# Patient Record
Sex: Female | Born: 1935 | Race: White | Hispanic: No | Smoking: Former smoker
Health system: Southern US, Community
[De-identification: ages and names within clinical notes are randomized; demographics above are authoritative.]

## PROBLEM LIST (undated history)

## (undated) ENCOUNTER — Emergency Department: Admission: EM | Discharge status: Absent without leave | Payer: Self-pay

## (undated) DIAGNOSIS — F32A Depression, unspecified: Secondary | ICD-10-CM

## (undated) DIAGNOSIS — I10 Essential (primary) hypertension: Secondary | ICD-10-CM

## (undated) DIAGNOSIS — F329 Major depressive disorder, single episode, unspecified: Secondary | ICD-10-CM

## (undated) DIAGNOSIS — G4700 Insomnia, unspecified: Secondary | ICD-10-CM

## (undated) HISTORY — PX: KNEE SURGERY: SHX244

## (undated) HISTORY — PX: CHOLECYSTECTOMY: SHX55

## (undated) HISTORY — PX: HERNIA REPAIR: SHX51

---

## 2009-06-10 ENCOUNTER — Ambulatory Visit: Payer: Self-pay | Admitting: Family Medicine

## 2009-06-10 DIAGNOSIS — I10 Essential (primary) hypertension: Secondary | ICD-10-CM | POA: Insufficient documentation

## 2009-06-10 DIAGNOSIS — Z8709 Personal history of other diseases of the respiratory system: Secondary | ICD-10-CM | POA: Insufficient documentation

## 2009-07-02 ENCOUNTER — Ambulatory Visit: Payer: Self-pay | Admitting: Family Medicine

## 2009-12-10 ENCOUNTER — Ambulatory Visit: Payer: Self-pay | Admitting: Emergency Medicine

## 2010-03-15 ENCOUNTER — Ambulatory Visit: Payer: Self-pay | Admitting: Family Medicine

## 2010-03-15 DIAGNOSIS — S20219A Contusion of unspecified front wall of thorax, initial encounter: Secondary | ICD-10-CM | POA: Insufficient documentation

## 2010-03-15 DIAGNOSIS — J209 Acute bronchitis, unspecified: Secondary | ICD-10-CM

## 2010-03-17 ENCOUNTER — Telehealth (INDEPENDENT_AMBULATORY_CARE_PROVIDER_SITE_OTHER): Payer: Self-pay | Admitting: *Deleted

## 2010-06-08 NOTE — Progress Notes (Signed)
  Phone Note Outgoing Call Call back at [facility] Phone [phone]   Call placed by: Lajean Saver RN,  March 17, 2010 12:36 PM Call placed to: Patient Summary of Call: Callback: No answer, message left with reason for call and call back with any questions or cocnerns

## 2010-06-08 NOTE — Assessment & Plan Note (Signed)
Summary: SINUS & COUGH/KH   Vital Signs:  Patient Profile:   77 Years Old Female CC:      Cold & URI symptoms Height:     64 inches Weight:      194 pounds O2 Sat:      96 % O2 treatment:    Room Air Temp:     100.5 degrees F oral Pulse rate:   80 / minute Pulse rhythm:   regular Resp:     20 per minute BP sitting:   143 / 72  (right arm) Cuff size:   regular  Pt. in pain?   no  Vitals Entered By: Lita Mains, RN                   Prior Medication List:  No prior medications documented  Updated Prior Medication List: VERAPAMIL HCL CR 240 MG CR-TABS (VERAPAMIL HCL) once daily  Current Allergies: No known allergies History of Present Illness History from: patient Chief Complaint: Cold & URI symptoms History of Present Illness: Patient has had scratchy throat blister and coughing. She had symptoms 10 days ago and then Saturday everything got worse. She has had blister inside her mouth initial. For 9+days she has had productive cough sinus comgestion and runny nose. No c/o fever. She currently has a temp of 100.5.  Also took care of a child w/ strep.  Current Problems: PHARYNGITIS, RAPID STREP TEST NEGATIVE (ICD-462) BRONCHITIS, ACUTE (ICD-466.0) UPPER RESPIRATORY INFECTION, ACUTE (ICD-465.9) FAMILY HISTORY DIABETES 1ST DEGREE RELATIVE (ICD-V18.0) PNEUMONIA, HX OF (ICD-V12.60) HYPERTENSION (ICD-401.9) UPPER RESPIRATORY INFECTION, ACUTE (ICD-465.9) BRONCHITIS, ACUTE (ICD-466.0) ACUTE NASOPHARYNGITIS (ICD-460)   Current Meds VERAPAMIL HCL CR 240 MG CR-TABS (VERAPAMIL HCL) once daily LORTAB 5 5-500 MG TABS (HYDROCODONE-ACETAMINOPHEN) once as needed BUMETANIDE 0.5 MG TABS (BUMETANIDE) once daily AUGMENTIN 875-125 MG TABS (AMOXICILLIN-POT CLAVULANATE) 1 by mouth 2 times daily * CLARITIN-D 24HRS/ OR ALEGRA-D 24 HRS 1 by mouth q day TUSSIONEX PENNKINETIC ER 8-10 MG/5ML LQCR (CHLORPHENIRAMINE-HYDROCODONE) sig 1 tsp by mouth twice a day  only fill if  affordable  REVIEW OF SYSTEMS Constitutional Symptoms       Complains of fever.     Denies chills and night sweats.  Eyes       Complains of glasses.      Denies eye pain and eye discharge. Ear/Nose/Throat/Mouth       Complains of frequent runny nose, sore throat, and hoarseness.      Denies change in hearing and ear pain.  Respiratory       Complains of productive cough, wheezing, and shortness of breath.      Denies dry cough, asthma, and bronchitis.  Cardiovascular       Denies murmurs and chest pain.    Gastrointestinal       Denies stomach pain and nausea/vomiting. Genitourniary       Denies painful urination. Musculoskeletal       Complains of muscle pain and joint pain.      Denies joint stiffness, decreased range of motion, redness, swelling, and muscle weakness.  Skin       Comments: hx of psoriasis Psych       Denies mood changes.  Past History:  Family History: Last updated: 06/10/2009 Family History Diabetes 1st degree relative-mother Family History of Stroke F 1st degree relative <60-mother  Social History: Last updated: 06/10/2009 Never Smoked Alcohol use-no Drug use-no  Risk Factors: Smoking Status: never (06/10/2009)  Past Medical History: Hypertension Pneumonia, hx of-2010  Past Surgical History: Cholecystectomy Inguinal herniorrhaphy  Family History: Reviewed history and no changes required. Family History Diabetes 1st degree relative-mother Family History of Stroke F 1st degree relative <60-mother  Social History: Reviewed history and no changes required. Never Smoked Alcohol use-no Drug use-no Smoking Status:  never Drug Use:  no Physical Exam General appearance: well developed, well nourished,mild distress Head: normocephalic, atraumatic Ears: normal, no lesions or deformities Nasal: pale, boggy, swollen nasal turbinates Oral/Pharynx: pharyngeal erythema with exudate, uvula midline without deviation Neck: supple,anterior  lymphadenopathy present Chest/Lungs: no rales, wheezes, or rhonchi bilateral, breath sounds equal without effort actively coughing Heart: regular rate and  rhythm, no murmur Neurological: grossly intact and non-focal Skin: no obvious rashes or lesions MSE: oriented to time, place, and person Assessment New Problems: PHARYNGITIS, RAPID STREP TEST NEGATIVE (ICD-462) BRONCHITIS, ACUTE (ICD-466.0) UPPER RESPIRATORY INFECTION, ACUTE (ICD-465.9) FAMILY HISTORY DIABETES 1ST DEGREE RELATIVE (ICD-V18.0) PNEUMONIA, HX OF (ICD-V12.60) HYPERTENSION (ICD-401.9) UPPER RESPIRATORY INFECTION, ACUTE (ICD-465.9) BRONCHITIS, ACUTE (ICD-466.0) ACUTE NASOPHARYNGITIS (ICD-460)  bronchitis                      nasopharyngitis           exposure to strp    may have started off w/ Coxsacchie virus  Patient Education: Patient and/or caregiver instructed in the following: rest fluids and Tylenol.  Plan New Medications/Changes: Sandria Senter ER 8-10 MG/5ML LQCR (CHLORPHENIRAMINE-HYDROCODONE) sig 1 tsp by mouth twice a day  only fill if affordable  #4 fl oz x 0, 06/10/2009, Hassan Rowan MD CLARITIN-D 24HRS/ OR ALEGRA-D 24 HRS 1 by mouth q day  #20 x 0, 06/10/2009, Hassan Rowan MD AUGMENTIN [phone] MG TABS (AMOXICILLIN-POT CLAVULANATE) 1 by mouth 2 times daily  #20 x 0, 06/10/2009, Hassan Rowan MD  New Orders: New Patient Level III [99203] T-Culture, Throat [04540-98119] Rapid Strep [14782] Follow Up: Follow up in 2-3 days if no improvement, Follow up on an as needed basis, Follow up with Primary Physician  The patient and/or caregiver has been counseled thoroughly with regard to medications prescribed including dosage, schedule, interactions, rationale for use, and possible side effects and they verbalize understanding.  Diagnoses and expected course of recovery discussed and will return if not improved as expected or if the condition worsens. Patient and/or caregiver verbalized understanding.   Prescriptions: TUSSIONEX PENNKINETIC ER 8-10 MG/5ML LQCR (CHLORPHENIRAMINE-HYDROCODONE) sig 1 tsp by mouth twice a day  only fill if affordable  #4 fl oz x 0   Entered and Authorized by:   Hassan Rowan MD   Signed by:   Hassan Rowan MD on 06/10/2009   Method used:   Print then Give to Patient   RxID:   [id number] CLARITIN-D 24HRS/ OR ALEGRA-D 24 HRS 1 by mouth q day  #20 x 0   Entered and Authorized by:   Hassan Rowan MD   Signed by:   Hassan Rowan MD on 06/10/2009   Method used:   Print then Give to Patient   RxID:   [id number] AUGMENTIN 875-125 MG TABS (AMOXICILLIN-POT CLAVULANATE) 1 by mouth 2 times daily  #20 x 0   Entered and Authorized by:   Hassan Rowan MD   Signed by:   Hassan Rowan MD on 06/10/2009   Method used:   Print then Give to Patient   RxID:   [id number]   Patient Instructions: 1)  Please schedule a follow-up appointment as needed. 2)  Please schedule an appointment with your primary doctor in : 3)  Take your antibiotic as prescribed until ALL of it is gone, but stop if you develop a rash or swelling and contact our office as soon as possible. 4)  Acute bronchitis symptoms for less than 10 days are not helped by antibiotics. take over the counter cough medications. call if no improvment in  5-7 days, sooner if increasing cough, fever, or new symptoms( shortness of breath, chest pain). 5)  strep culture obtained  Laboratory Results  Date/Time Received: June 10, 2009 11:41 AM  Date/Time Reported: June 10, 2009 11:41 AM   Other Tests  Rapid Strep: negative  Kit Test Internal QC: Negative   (Normal Range: Negative)

## 2010-06-08 NOTE — Assessment & Plan Note (Signed)
Summary: COUGH/TJ (rm 4)   Vital Signs:  Patient Profile:   77 Years Old Female CC:      dry and productive cough, SOB, fatigue with exertion x 5 days Height:     64 inches Weight:      191 pounds O2 Sat:      95 % O2 treatment:    Room Air Temp:     98.3 degrees F oral Pulse rate:   68 / minute Resp:     14 per minute BP sitting:   133 / 46  (left arm) Cuff size:   regular  Pt. in pain?   no  Vitals Entered By: Lajean Saver RN (March 15, 2010 1:37 PM)                   Updated Prior Medication List: VERAPAMIL HCL CR 240 MG CR-TABS (VERAPAMIL HCL) once daily LORTAB 5 5-500 MG TABS (HYDROCODONE-ACETAMINOPHEN) once as needed TESSALON 200 MG CAPS (BENZONATATE) 1 by mouth Q 12 HRS. NITROFURANTOIN MONOHYD MACRO 100 MG CAPS (NITROFURANTOIN MONOHYD MACRO)  TRIAMTERENE-HCTZ 37.5-25 MG TABS (TRIAMTERENE-HCTZ) once daily TESSALON PERLES 100 MG CAPS (BENZONATATE) 1 tab by mouth up to three times a day as needed for cough  Current Allergies: ! * LATEXHistory of Present Illness Chief Complaint: dry and productive cough, SOB, fatigue with exertion x 5 days History of Present Illness:  Subjective: Patient complains of URI symptoms that started 1.5 weeks ago with a dry cough and sinus congestion, and the cough persists, worse at night.  She fell one week ago while cleaning a deck, striking her right chest.  She was seen in the ER where she states a chest X-ray was negative.  She states that she was dispensed a rib belt at that time.   She continues to have soreness in her right lateral chest. No sore throat No pleuritic pain No wheezing + mild nasal congestion ? post-nasal drainage No sinus pain/pressure No itchy/red eyes No earache No hemoptysis No SOB No fever/chills No nausea No vomiting No abdominal pain No diarrhea No skin rashes No fatigue No myalgias No headache Used OTC meds without relief   REVIEW OF SYSTEMS Constitutional Symptoms      Denies fever,  chills, night sweats, weight loss, weight gain, and fatigue.  Eyes       Denies change in vision, eye pain, eye discharge, glasses, contact lenses, and eye surgery. Ear/Nose/Throat/Mouth       Complains of hoarseness.      Denies hearing loss/aids, change in hearing, ear pain, ear discharge, dizziness, frequent runny nose, frequent nose bleeds, sinus problems, sore throat, and tooth pain or bleeding.  Respiratory       Complains of dry cough, productive cough, and shortness of breath.      Denies wheezing, asthma, bronchitis, and emphysema/COPD.  Cardiovascular       Denies murmurs, chest pain, and tires easily with exhertion.    Gastrointestinal       Denies stomach pain, nausea/vomiting, diarrhea, constipation, blood in bowel movements, and indigestion. Genitourniary       Denies painful urination, blood or discharge from vagina, kidney stones, and loss of urinary control. Neurological       Denies paralysis, seizures, and fainting/blackouts. Musculoskeletal       Denies muscle pain, joint pain, joint stiffness, decreased range of motion, redness, swelling, muscle weakness, and gout.  Skin       Denies bruising, unusual mles/lumps or sores, and hair/skin  or nail changes.  Psych       Denies mood changes, temper/anger issues, anxiety/stress, speech problems, depression, and sleep problems. Other Comments: Patient fell while raking leaves about a week ago and bruised her right ribs. Patienthas taken Claritin, Nasonex, and tessalon pearls   Past History:  Past Medical History: Reviewed history from 06/10/2009 and no changes required. Hypertension Pneumonia, hx of-2010  Past Surgical History: Reviewed history from 06/10/2009 and no changes required. Cholecystectomy Inguinal herniorrhaphy  Family History: Reviewed history from 06/10/2009 and no changes required. Family History Diabetes 1st degree relative-mother Family History of Stroke F 1st degree relative <60-mother  Social  History: Reviewed history from 06/10/2009 and no changes required. Never Smoked Alcohol use-no Drug use-no   Objective:  No acute distress  Eyes:  Pupils are equal, round, and reactive to light and accomdation.  Extraocular movement is intact.  Conjunctivae are not inflamed.  Ears:  Canals normal.  Tympanic membranes normal.   Nose:  Normal septum.  Normal turbinates, mildly congested.   No sinus tenderness present. Pharynx:  Normal  Neck:  Supple.  No adenopathy is present.  No thyromegaly is present  Lungs:  Clear to auscultation.  Breath sounds are equal.  Chest:  Mild tenderness right lateral anterior chest but no crepitance Heart:  Regular rate and rhythm without murmurs, rubs, or gallops.  Abdomen:  Nontender without masses or hepatosplenomegaly.  Bowel sounds are present.  No CVA or flank tenderness.  Extremities:  No edema.   Skin:  No rash Assessment New Problems: CONTUSION, RIGHT CHEST WALL (ICD-922.1) ACUTE BRONCHITIS (ICD-466.0)   Plan New Medications/Changes: BENZONATATE 200 MG CAPS (BENZONATATE) One by mouth hs as needed cough  #12 x 0, 03/15/2010, Donna Christen MD AMOXICILLIN 875 MG TABS (AMOXICILLIN) One by mouth two times a day  #14 x 0, 03/15/2010, Donna Christen MD  New Orders: Est. Patient Level IV [16109] Planning Comments:   Begin amoxicillin, expectorant, cough suppressant at bedtime.  Increase fluid intake Recommend that she resume wearing her rib belt as needed.  May take ibuprofen as needed for rib discomfort. Followup with PCP if not improving 7 to 10 days.  Return for worsening symptoms.   The patient and/or caregiver has been counseled thoroughly with regard to medications prescribed including dosage, schedule, interactions, rationale for use, and possible side effects and they verbalize understanding.  Diagnoses and expected course of recovery discussed and will return if not improved as expected or if the condition worsens. Patient and/or caregiver  verbalized understanding.  Prescriptions: BENZONATATE 200 MG CAPS (BENZONATATE) One by mouth hs as needed cough  #12 x 0   Entered and Authorized by:   Donna Christen MD   Signed by:   Donna Christen MD on 03/15/2010   Method used:   Print then Give to Patient   RxID:   [id number] AMOXICILLIN 875 MG TABS (AMOXICILLIN) One by mouth two times a day  #14 x 0   Entered and Authorized by:   Donna Christen MD   Signed by:   Donna Christen MD on 03/15/2010   Method used:   Print then Give to Patient   RxID:   [id number]   Patient Instructions: 1)  Take Mucinex (guaifenesin) twice daily with plenty of fluids. 2)  Wear rib belt day time as needed. 3)  May take Ibuprofen 200mg , 4 tabs every 8 hours with food as needed for rib pain.  Orders Added: 1)  Est. Patient Level IV [29528]

## 2010-06-08 NOTE — Assessment & Plan Note (Signed)
Summary: CONGESTION,RUNNY NOSE,COUGH/TJ   Vital Signs:  Patient Profile:   77 Years Old Female CC:      productive cough, runny nose, sneezing, hoarsness X 10 daYS Height:     64 inches Weight:      192 pounds O2 Sat:      96 % O2 treatment:    Room Air Temp:     98.1 degrees F oral Pulse rate:   70 / minute Pulse rhythm:   regular Resp:     18 per minute BP sitting:   137 / 83  (right arm) Cuff size:   regular  Pt. in pain?   no  Vitals Entered By: Lajean Saver RN (July 02, 2009 12:23 PM)                   Updated Prior Medication List: VERAPAMIL HCL CR 240 MG CR-TABS (VERAPAMIL HCL) once daily LORTAB 5 5-500 MG TABS (HYDROCODONE-ACETAMINOPHEN) once as needed  Current Allergies (reviewed today): No known allergies History of Present Illness Chief Complaint: productive cough, runny nose, sneezing, hoarsness X 10 daYS History of Present Illness: PATIENT C/O COUGH AND CONGESTION. NO FEVER. WAS SEEN INITIALLY ON 2 FEB AND DXED WITH URI SINUSITIS. TREATED WITH AMOX. GOT OVER THAT THEN DEVELOPED SINUS CONGESTION. STATES SHE SAW HER PCP AND COMPLETED ERYTHOMYCIN SAT. COUGH IS WORSE AT NIGHT. SHE IS CONCERNED FOR POSSIBLE PNEUMONIA  REVIEW OF SYSTEMS Constitutional Symptoms       Complains of fatigue.     Denies fever, chills, night sweats, weight loss, and weight gain.  Eyes       Denies change in vision, eye pain, eye discharge, glasses, contact lenses, and eye surgery. Ear/Nose/Throat/Mouth       Complains of frequent runny nose, sinus problems, and hoarseness.      Denies hearing loss/aids, change in hearing, ear pain, ear discharge, dizziness, frequent nose bleeds, sore throat, and tooth pain or bleeding.  Respiratory       Complains of productive cough.      Denies dry cough, wheezing, shortness of breath, asthma, bronchitis, and emphysema/COPD.  Cardiovascular       Denies murmurs, chest pain, and tires easily with exhertion.    Gastrointestinal       Denies  stomach pain, nausea/vomiting, diarrhea, constipation, blood in bowel movements, and indigestion. Genitourniary       Denies painful urination, kidney stones, and loss of urinary control. Neurological       Denies paralysis, seizures, and fainting/blackouts. Musculoskeletal       Denies muscle pain, joint pain, joint stiffness, decreased range of motion, redness, swelling, muscle weakness, and gout.  Skin       Denies bruising, unusual mles/lumps or sores, and hair/skin or nail changes.  Psych       Denies mood changes, temper/anger issues, anxiety/stress, speech problems, depression, and sleep problems. Other Comments: Patient recently finished antibiotic Rx for sinus infection on Saturday   Past History:  Past Medical History: Reviewed history from 06/10/2009 and no changes required. Hypertension Pneumonia, hx of-2010  Past Surgical History: Reviewed history from 06/10/2009 and no changes required. Cholecystectomy Inguinal herniorrhaphy  Family History: Reviewed history from 06/10/2009 and no changes required. Family History Diabetes 1st degree relative-mother Family History of Stroke F 1st degree relative <60-mother  Social History: Reviewed history from 06/10/2009 and no changes required. Never Smoked Alcohol use-no Drug use-no Physical Exam General appearance: well developed, well nourished, no acute distress Head: normocephalic, atraumatic Ears: normal,  no lesions or deformities Nasal: mucosa pink, nonedematous, no septal deviation, turbinates normal Oral/Pharynx: tongue normal, posterior pharynx without erythema or exudate Chest/Lungs: no rales, wheezes, or rhonchi bilateral, breath sounds equal without effort. CONGESTED COUGH Heart: regular rate and  rhythm, no murmur Skin: no obvious rashes or lesions Assessment New Problems: UPPER RESPIRATORY INFECTION, ACUTE (ICD-465.9)   Plan New Medications/Changes: TESSALON 200 MG CAPS (BENZONATATE) 1 by mouth Q 12  HRS.  ##14 x 0, 07/02/2009, Marvis Moeller DO DOXYCYCLINE HYCLATE 100 MG CAPS (DOXYCYCLINE HYCLATE) 1 by mouth Q 12 HRS  ##20 x 0, 07/02/2009, Marvis Moeller DO  New Orders: Est. Patient Level II [16109]   Prescriptions: TESSALON 200 MG CAPS (BENZONATATE) 1 by mouth Q 12 HRS.  ##14 x 0   Entered and Authorized by:   Marvis Moeller DO   Signed by:   Marvis Moeller DO on 07/02/2009   Method used:   Print then Give to Patient   RxID:   [id number] DOXYCYCLINE HYCLATE 100 MG CAPS (DOXYCYCLINE HYCLATE) 1 by mouth Q 12 HRS  ##20 x 0   Entered and Authorized by:   Marvis Moeller DO   Signed by:   Marvis Moeller DO on 07/02/2009   Method used:   Print then Give to Patient   RxID:   [id number]   Patient Instructions: 1)  TYLENOL OR MOTRIN AS NEEDED. AVOID CAFFEINE AND MILK PRODUCTS. MUCINEX DM RECOMMENDED. FOLLOW UP WITH YOUR PCP OR RETURN IF SYMPTOMS PERSIST OR WORSEN.

## 2010-06-08 NOTE — Assessment & Plan Note (Signed)
Summary: SINUS AND COUGH   Vital Signs:  Patient Profile:   77 Years Old Female CC:      cough, wheeze, sinus pressure, low grade temp X 1 week Height:     64 inches Weight:      193 pounds O2 Sat:      95 % O2 treatment:    Room Air Temp:     99.0 degrees F oral Pulse rate:   75 / minute Resp:     14 per minute BP sitting:   132 / 78  (right arm) Cuff size:   regular  Pt. in pain?   no  Vitals Entered By: Lajean Saver RN (December 10, 2009 11:57 AM)                   Updated Prior Medication List: VERAPAMIL HCL CR 240 MG CR-TABS (VERAPAMIL HCL) once daily LORTAB 5 5-500 MG TABS (HYDROCODONE-ACETAMINOPHEN) once as needed DOXYCYCLINE HYCLATE 100 MG CAPS (DOXYCYCLINE HYCLATE) 1 by mouth Q 12 HRS TESSALON 200 MG CAPS (BENZONATATE) 1 by mouth Q 12 HRS. NITROFURANTOIN MONOHYD MACRO 100 MG CAPS (NITROFURANTOIN MONOHYD MACRO)  TRIAMTERENE-HCTZ 37.5-25 MG TABS (TRIAMTERENE-HCTZ) once daily  Current Allergies (reviewed today): No known allergies History of Present Illness Chief Complaint: cough, wheeze, sinus pressure, low grade temp X 1 week History of Present Illness: Cough, wheeze, sinus pressure, 99 temp for 1 week.  Had a UTI 2 weeks ago tx w/ Macrobid, but she is still having frequency, but no more burning.  Now her URI symptoms are bothering her.  She has no sick contacts except a granddaughter in daycare.    REVIEW OF SYSTEMS Constitutional Symptoms       Complains of fatigue.     Denies fever, chills, night sweats, weight loss, and weight gain.  Eyes       Denies change in vision, eye pain, eye discharge, glasses, contact lenses, and eye surgery. Ear/Nose/Throat/Mouth       Complains of sinus problems and hoarseness.      Denies hearing loss/aids, change in hearing, ear pain, ear discharge, dizziness, frequent runny nose, frequent nose bleeds, sore throat, and tooth pain or bleeding.  Respiratory       Complains of productive cough, wheezing, and shortness of breath.       Denies dry cough, asthma, bronchitis, and emphysema/COPD.  Cardiovascular       Denies murmurs, chest pain, and tires easily with exhertion.    Gastrointestinal       Denies stomach pain, nausea/vomiting, diarrhea, constipation, blood in bowel movements, and indigestion. Genitourniary       Denies painful urination, kidney stones, and loss of urinary control. Neurological       Denies paralysis, seizures, and fainting/blackouts. Musculoskeletal       Denies muscle pain, joint pain, joint stiffness, decreased range of motion, redness, swelling, muscle weakness, and gout.  Skin       Denies bruising, unusual mles/lumps or sores, and hair/skin or nail changes.  Psych       Denies mood changes, temper/anger issues, anxiety/stress, speech problems, depression, and sleep problems. Other Comments: patinet is currently on antibiotic series for UTI   Past History:  Past Medical History: Reviewed history from 06/10/2009 and no changes required. Hypertension Pneumonia, hx of-2010  Past Surgical History: Reviewed history from 06/10/2009 and no changes required. Cholecystectomy Inguinal herniorrhaphy  Family History: Reviewed history from 06/10/2009 and no changes required. Family History Diabetes 1st degree relative-mother Family  History of Stroke F 1st degree relative <60-mother  Social History: Reviewed history from 06/10/2009 and no changes required. Never Smoked Alcohol use-no Drug use-no Physical Exam General appearance: well developed, well nourished, no acute distress Ears: normal, no lesions or deformities Nasal: mucosa pink, nonedematous, no septal deviation, turbinates normal Oral/Pharynx: tongue normal, posterior pharynx without erythema or exudate Chest/Lungs: no rales, wheezes, or rhonchi bilateral, breath sounds equal without effort Heart: regular rate and  rhythm, no murmur Skin: no obvious rashes or lesions Assessment New Problems: UPPER RESPIRATORY INFECTION  (ICD-465.9)  Likely viral URI, but will treat with ABX due to age.  Also will tx with Cipro per her request because she doesn't feel that the Macrobid has worked well.  Patient Education: Patient and/or caregiver instructed in the following: rest, fluids.  Plan New Medications/Changes: TESSALON PERLES 100 MG CAPS (BENZONATATE) 1 tab by mouth up to three times a day as needed for cough  #24 x 0, 12/10/2009, Hoyt Koch MD CIPRO 250 MG TABS (CIPROFLOXACIN HCL) 1 tab by mouth two times a day for 5 days  #10 x 0, 12/10/2009, Hoyt Koch MD ZITHROMAX Z-PAK 250 MG TABS (AZITHROMYCIN) use as directed  #1 x 0, 12/10/2009, Hoyt Koch MD  New Orders: Est. Patient Level II [02585] Planning Comments:   Follow-up with your primary care physician  Follow Up: Follow up in 2-3 days if no improvement  The patient and/or caregiver has been counseled thoroughly with regard to medications prescribed including dosage, schedule, interactions, rationale for use, and possible side effects and they verbalize understanding.  Diagnoses and expected course of recovery discussed and will return if not improved as expected or if the condition worsens. Patient and/or caregiver verbalized understanding.  Prescriptions: TESSALON PERLES 100 MG CAPS (BENZONATATE) 1 tab by mouth up to three times a day as needed for cough  #24 x 0   Entered and Authorized by:   Hoyt Koch MD   Signed by:   Hoyt Koch MD on 12/10/2009   Method used:   Printed then faxed to ...       [facility] [phone]* (retail)       [address] [place], [place]  [address]       Ph: [phone]       Fax: [phone]   RxID:   [id number] CIPRO 250 MG TABS (CIPROFLOXACIN HCL) 1 tab by mouth two times a day for 5 days  #10 x 0   Entered and Authorized by:   Hoyt Koch MD   Signed by:   Hoyt Koch MD on 12/10/2009   Method used:   Printed then faxed to ...       [facility] [phone]* (retail)       [address] [place], [place]  [address]       Ph: [phone]       Fax: [phone]   RxID:   [id number] ZITHROMAX Z-PAK 250 MG TABS (AZITHROMYCIN) use as directed  #1 x 0   Entered and Authorized by:   Hoyt Koch MD   Signed by:   Hoyt Koch MD on 12/10/2009   Method used:   Printed then faxed to ...       [facility] [phone]* (retail)       [address] [place], [place]  [address]  Ph: [phone]       Fax: [phone]   RxID:   [id number]   Orders Added: 1)  Est. Patient Level II [40347]

## 2010-06-25 ENCOUNTER — Ambulatory Visit (INDEPENDENT_AMBULATORY_CARE_PROVIDER_SITE_OTHER): Payer: Medicare Other | Admitting: Emergency Medicine

## 2010-06-25 ENCOUNTER — Encounter: Payer: Self-pay | Admitting: Emergency Medicine

## 2010-06-25 DIAGNOSIS — J069 Acute upper respiratory infection, unspecified: Secondary | ICD-10-CM

## 2010-06-27 ENCOUNTER — Telehealth (INDEPENDENT_AMBULATORY_CARE_PROVIDER_SITE_OTHER): Payer: Self-pay | Admitting: *Deleted

## 2010-06-29 ENCOUNTER — Ambulatory Visit
Admission: RE | Admit: 2010-06-29 | Discharge: 2010-06-29 | Disposition: A | Discharge status: Home / Self Care | Payer: Medicare Other | Source: Ambulatory Visit | Attending: Emergency Medicine | Admitting: Emergency Medicine

## 2010-06-29 ENCOUNTER — Other Ambulatory Visit: Payer: Self-pay | Admitting: Emergency Medicine

## 2010-06-29 ENCOUNTER — Ambulatory Visit (INDEPENDENT_AMBULATORY_CARE_PROVIDER_SITE_OTHER): Payer: Medicare Other | Admitting: Emergency Medicine

## 2010-06-29 ENCOUNTER — Encounter: Payer: Self-pay | Admitting: Emergency Medicine

## 2010-06-29 DIAGNOSIS — J069 Acute upper respiratory infection, unspecified: Secondary | ICD-10-CM

## 2010-06-29 DIAGNOSIS — J13 Pneumonia due to Streptococcus pneumoniae: Secondary | ICD-10-CM | POA: Insufficient documentation

## 2010-06-30 NOTE — Assessment & Plan Note (Signed)
Summary: COUGHING rm 5   Vital Signs:  Patient Profile:   77 Years Old Female CC:      Cough Height:     64 inches Weight:      198.25 pounds O2 Sat:      96 % O2 treatment:    Room Air Temp:     99.2 degrees F oral Pulse rate:   70 / minute Resp:     18 per minute BP sitting:   153 / 75  (left arm) Cuff size:   regular  Vitals Entered By: Clemens Catholic LPN (June 25, 2010 8:38 AM)                  Updated Prior Medication List: VERAPAMIL HCL CR 240 MG CR-TABS (VERAPAMIL HCL) once daily TRIAMTERENE-HCTZ 37.5-25 MG TABS (TRIAMTERENE-HCTZ) once daily LORAZEPAM 0.5 MG TABS (LORAZEPAM)  PRAVASTATIN SODIUM 20 MG TABS (PRAVASTATIN SODIUM)  ASPIRIN 81 MG TBEC (ASPIRIN)   Current Allergies: ! PREDNISONE ! * LATEXHistory of Present Illness History from: patient Chief Complaint: Cough History of Present Illness: 77 Years Old Female complains of onset of cold symptoms for 1 days.  Yachet has been using Tessalon, Mucinex which is helping a little bit. + sore throat + cough No pleuritic pain No wheezing + nasal congestion + post-nasal drainage No sinus pain/pressure No chest congestion No itchy/red eyes No earache No hemoptysis No SOB + chills/sweats No fever No nausea No vomiting No abdominal pain No diarrhea No skin rashes No fatigue No myalgias No headache   REVIEW OF SYSTEMS Constitutional Symptoms      Denies fever, chills, night sweats, weight loss, weight gain, and fatigue.  Eyes       Complains of eye surgery.      Denies change in vision, eye pain, eye discharge, glasses, and contact lenses. Ear/Nose/Throat/Mouth       Complains of sinus problems.      Denies hearing loss/aids, change in hearing, ear pain, ear discharge, dizziness, frequent runny nose, frequent nose bleeds, sore throat, hoarseness, and tooth pain or bleeding.  Respiratory       Complains of productive cough, wheezing, and shortness of breath.      Denies dry cough, asthma,  bronchitis, and emphysema/COPD.  Cardiovascular       Denies murmurs, chest pain, and tires easily with exhertion.    Gastrointestinal       Denies stomach pain, nausea/vomiting, diarrhea, constipation, blood in bowel movements, and indigestion. Genitourniary       Denies painful urination, kidney stones, and loss of urinary control. Neurological       Denies paralysis, seizures, and fainting/blackouts. Musculoskeletal       Denies muscle pain, joint pain, joint stiffness, decreased range of motion, redness, swelling, muscle weakness, and gout.  Skin       Denies bruising, unusual mles/lumps or sores, and hair/skin or nail changes.  Psych       Denies mood changes, temper/anger issues, anxiety/stress, speech problems, depression, and sleep problems. Other Comments: pt c/o cough, chest hurts to cough and SOB x 1day. she has taken Mucinex and Tessalon Perle.   Past History:  Past Medical History: Reviewed history from 06/10/2009 and no changes required. Hypertension Pneumonia, hx of-2010  Past Surgical History: Reviewed history from 06/10/2009 and no changes required. Cholecystectomy Inguinal herniorrhaphy  Family History: Reviewed history from 06/10/2009 and no changes required. Family History Diabetes 1st degree relative-mother Family History of Stroke F 1st degree relative <60-mother  Social History: Reviewed history from 06/10/2009 and no changes required. Never Smoked Alcohol use-no Drug use-no Physical Exam General appearance: well developed, well nourished, no acute distress Ears: normal, no lesions or deformities Nasal: clear discharge Oral/Pharynx: clear PND, no erythema Chest/Lungs: no rales, wheezes, or rhonchi bilateral, breath sounds equal without effort Heart: regular rate and  rhythm, no murmur Skin: no obvious rashes or lesions MSE: oriented to time, place, and person Assessment New Problems: UPPER RESPIRATORY INFECTION, ACUTE (ICD-465.9)   Patient  Education: Patient and/or caregiver instructed in the following: rest, fluids.  Plan New Medications/Changes: ZITHROMAX Z-PAK 250 MG TABS (AZITHROMYCIN) use as directed  #1 x 0, 06/25/2010, Hoyt Koch MD  New Orders: Est. Patient Level III [16109] Pulse Oximetry (single measurment) [94760] Planning Comments:   1)  Take the prescribed antibiotic as instructed. 2)  Use nasal saline solution (over the counter) at least 3 times a day. 3)  Use over the counter decongestants like Zyrtec-D every 12 hours as needed to help with congestion. 4)  Can take tylenol every 6 hours or motrin every 8 hours for pain or fever. 5)  Follow up with your primary doctor  if no improvement in 5-7 days, sooner if increasing pain, fever, or new symptoms.   6) Continue your tessalon 7) If you think you're getting worse, return to clinic for CXR   The patient and/or caregiver has been counseled thoroughly with regard to medications prescribed including dosage, schedule, interactions, rationale for use, and possible side effects and they verbalize understanding.  Diagnoses and expected course of recovery discussed and will return if not improved as expected or if the condition worsens. Patient and/or caregiver verbalized understanding.  Prescriptions: ZITHROMAX Z-PAK 250 MG TABS (AZITHROMYCIN) use as directed  #1 x 0   Entered and Authorized by:   Hoyt Koch MD   Signed by:   Hoyt Koch MD on 06/25/2010   Method used:   Printed then faxed to ...       [facility] [phone]* (retail)       [address] [place], [place]  [address]       Ph: [phone]       Fax: [phone]   RxID:   [id number]   Orders Added: 1)  Est. Patient Level III [02725] 2)  Pulse Oximetry (single measurment) [36644]

## 2010-07-02 ENCOUNTER — Telehealth (INDEPENDENT_AMBULATORY_CARE_PROVIDER_SITE_OTHER): Payer: Self-pay | Admitting: *Deleted

## 2010-07-06 NOTE — Assessment & Plan Note (Signed)
Summary: COUGH/SNEEZING/FEVER rm 4   Vital Signs:  Patient Profile:   77 Years Old Female CC:      cough, HA Height:     64 inches O2 Sat:      95 % O2 treatment:    Room Air Temp:     100.2 degrees F oral Pulse rate:   69 / minute Resp:     18 per minute BP sitting:   130 / 58  (left arm) Cuff size:   regular  Vitals Entered By: Clemens Catholic LPN (June 29, 2010 4:16 PM)                  Updated Prior Medication List: VERAPAMIL HCL CR 240 MG CR-TABS (VERAPAMIL HCL) once daily TRIAMTERENE-HCTZ 37.5-25 MG TABS (TRIAMTERENE-HCTZ) once daily LORAZEPAM 0.5 MG TABS (LORAZEPAM)  PRAVASTATIN SODIUM 20 MG TABS (PRAVASTATIN SODIUM)  ASPIRIN 81 MG TBEC (ASPIRIN)  ZITHROMAX Z-PAK 250 MG TABS (AZITHROMYCIN) use as directed  Current Allergies (reviewed today): ! PREDNISONE ! * LATEXHistory of Present Illness Chief Complaint: cough, HA History of Present Illness: She returns to clinic not feeling any better.  She was last seen a few days ago after being sick for 1 day.  It has now been 5 days.  Still with cough, congestion, sore throat.  She took her Zpak.  REVIEW OF SYSTEMS Constitutional Symptoms      Denies fever, chills, night sweats, weight loss, weight gain, and fatigue.  Eyes       Denies change in vision, eye pain, eye discharge, glasses, contact lenses, and eye surgery. Ear/Nose/Throat/Mouth       Complains of sinus problems and hoarseness.      Denies hearing loss/aids, change in hearing, ear pain, ear discharge, dizziness, frequent runny nose, frequent nose bleeds, sore throat, and tooth pain or bleeding.  Respiratory       Complains of productive cough and wheezing.      Denies dry cough, shortness of breath, asthma, bronchitis, and emphysema/COPD.  Cardiovascular       Denies murmurs, chest pain, and tires easily with exhertion.    Gastrointestinal       Denies stomach pain, nausea/vomiting, diarrhea, constipation, blood in bowel movements, and  indigestion. Genitourniary       Denies painful urination, kidney stones, and loss of urinary control. Neurological       Denies paralysis, seizures, and fainting/blackouts. Musculoskeletal       Denies muscle pain, joint pain, joint stiffness, decreased range of motion, redness, swelling, muscle weakness, and gout.  Skin       Denies bruising, unusual mles/lumps or sores, and hair/skin or nail changes.  Psych       Denies mood changes, temper/anger issues, anxiety/stress, speech problems, depression, and sleep problems. Other Comments: pt c/o productive cough, HA, head and chest congestion no better. fever started yesterday. she is taking OTC Mucinex. she took her last pill of her Zpak today.   Past History:  Past Medical History: Reviewed history from 06/10/2009 and no changes required. Hypertension Pneumonia, hx of-2010  Past Surgical History: Reviewed history from 06/10/2009 and no changes required. Cholecystectomy Inguinal herniorrhaphy  Family History: Reviewed history from 06/10/2009 and no changes required. Family History Diabetes 1st degree relative-mother Family History of Stroke F 1st degree relative <60-mother  Social History: Reviewed history from 06/10/2009 and no changes required. Never Smoked Alcohol use-no Drug use-no Physical Exam General appearance: well developed, well nourished, no acute distress Ears: slight TM erythema R  Nasal: nasal congestion Oral/Pharynx: clear PND, no erythema Chest/Lungs: mild scattered rhonchi Heart: regular rate and  rhythm, no murmur MSE: oriented to time, place, and person Assessment New Problems: PNEUMONIA, COMMUNITY ACQUIRED, PNEUMOCOCCAL (ICD-481) COUGH (ICD-786.2) UPPER RESPIRATORY INFECTION, ACUTE (ICD-465.9)   Plan New Medications/Changes: AVELOX 400 MG TABS (MOXIFLOXACIN HCL) 1 daily for 7 days  #7 x 0, 06/29/2010, Hoyt Koch MD HYDROCODONE-HOMATROPINE 5-1.5 MG/5ML SYRP [facility]) 5cc  q6 hrs as needed cough  #5oz x 0, 06/29/2010, Hoyt Koch MD  New Orders: Est. Patient Level IV [41324] Pulse Oximetry (single measurment) [94760] T-Chest x-ray, 2 views [71020] Rocephin  250mg  [J0696] Admin of Therapeutic Inj  intramuscular or subcutaneous [96372] Planning Comments:   Xray shows a small focus of possible PNA R midlung with bronchitic changs.  Suggest follow up with her PCP in 1 week.  Will need repeat CXR to confirm recovery in a few weeks. 1)  Take the prescribed antibiotic as instructed.  Finished Zpak, will start Avelox for 1 week 2)  Use nasal saline solution (over the counter) at least 3 times a day. 3)  Use over the counter Mucinex 4)  Can take tylenol every 6 hours or motrin every 8 hours for pain or fever. 5)  Follow up with your primary doctor  if no improvement in 5-7 days, sooner if increasing pain, fever, or new symptoms.    The patient and/or caregiver has been counseled thoroughly with regard to medications prescribed including dosage, schedule, interactions, rationale for use, and possible side effects and they verbalize understanding.  Diagnoses and expected course of recovery discussed and will return if not improved as expected or if the condition worsens. Patient and/or caregiver verbalized understanding.  Prescriptions: AVELOX 400 MG TABS (MOXIFLOXACIN HCL) 1 daily for 7 days  #7 x 0   Entered and Authorized by:   Hoyt Koch MD   Signed by:   Hoyt Koch MD on 06/29/2010   Method used:   Print then Give to Patient   RxID:   [id number] HYDROCODONE-HOMATROPINE 5-1.5 MG/5ML SYRP (HYDROCODONE-HOMATROPINE) 5cc q6 hrs as needed cough  #5oz x 0   Entered and Authorized by:   Hoyt Koch MD   Signed by:   Hoyt Koch MD on 06/29/2010   Method used:   Print then Give to Patient   RxID:   [id number]   Medication Administration  Injection # 1:    Medication: Rocephin  250mg     Diagnosis: PNEUMONIA, COMMUNITY  ACQUIRED, PNEUMOCOCCAL (ICD-481)    Route: IM    Site: RUOQ gluteus    Exp Date: 09/06/2012    Lot #: [id number]    Mfr: [email or web address]    Comments: 1gram given    Patient tolerated injection without complications    Given by: Clemens Catholic LPN (June 29, 2010 5:11 PM)  Orders Added: 1)  Est. Patient Level IV [16606] 2)  Pulse Oximetry (single measurment) [94760] 3)  T-Chest x-ray, 2 views [71020] 4)  Rocephin  250mg  [J0696] 5)  Admin of Therapeutic Inj  intramuscular or subcutaneous [30160]

## 2010-07-06 NOTE — Progress Notes (Signed)
  Phone Note Outgoing Call Call back at [facility] Phone [phone] P PH     Call placed by: Lajean Saver RN,  July 02, 2010 3:06 PM Call placed to: Patient Action Taken: Phone Call Completed Summary of Call: Callback: Patient reports she is feeling better but is wheezing.  Rx fir proventil HFA inhaler called into [facility] @ [address] in [place]

## 2010-07-06 NOTE — Progress Notes (Signed)
  Phone Note Outgoing Call   Call placed by: Clemens Catholic LPN,  June 27, 2010 3:56 PM Call placed to: Patient Summary of Call: call back: no answer @ pts home #. Initial call taken by: Clemens Catholic LPN,  June 27, 2010 3:56 PM

## 2011-05-09 ENCOUNTER — Encounter: Payer: Self-pay | Admitting: Emergency Medicine

## 2011-05-09 ENCOUNTER — Emergency Department (INDEPENDENT_AMBULATORY_CARE_PROVIDER_SITE_OTHER)
Admission: EM | Admit: 2011-05-09 | Discharge: 2011-05-09 | Disposition: A | Discharge status: Home / Self Care | Payer: Medicare Other | Source: Home / Self Care | Attending: Emergency Medicine | Admitting: Emergency Medicine

## 2011-05-09 ENCOUNTER — Emergency Department
Admit: 2011-05-09 | Discharge: 2011-05-09 | Disposition: A | Discharge status: Home / Self Care | Payer: Medicare Other | Attending: Emergency Medicine | Admitting: Emergency Medicine

## 2011-05-09 DIAGNOSIS — R05 Cough: Secondary | ICD-10-CM

## 2011-05-09 DIAGNOSIS — J189 Pneumonia, unspecified organism: Secondary | ICD-10-CM

## 2011-05-09 DIAGNOSIS — J209 Acute bronchitis, unspecified: Secondary | ICD-10-CM

## 2011-05-09 HISTORY — DX: Essential (primary) hypertension: I10

## 2011-05-09 MED ORDER — CEFTRIAXONE SODIUM 1 G IJ SOLR
1.0000 g | Freq: Once | INTRAMUSCULAR | Status: AC
  Administered 2011-05-09: 1 g via INTRAMUSCULAR

## 2011-05-09 MED ORDER — LEVOFLOXACIN 750 MG PO TABS: 750.0000 mg | ORAL_TABLET | Freq: Every day | ORAL | Status: AC

## 2011-05-09 NOTE — ED Provider Notes (Signed)
History     CSN: [id number]  Arrival date & time 05/09/11  0945   First MD Initiated Contact with Patient 05/09/11 1028      Chief Complaint  Patient presents with  . Cough    (Consider location/radiation/quality/duration/timing/severity/associated sxs/prior treatment) HPI Seth is a 77 y.o. female who complains of onset of cold symptoms for 2 weeks. About 2 weeks ago she was diagnosed with the flu and given Tamiflu and her symptoms have somewhat improved. However she is still experiencing coughing, some chest tightness and hoarseness and some shortness of breath. She is concerned that she may have developed pneumonia. No sore throat + cough No pleuritic pain +wheezing + nasal congestion + post-nasal drainage + sinus pain/pressure + chest congestion No itchy/red eyes No earache No hemoptysis + SOB from coughing No chills/sweats No fever No nausea No vomiting No abdominal pain No diarrhea No skin rashes No fatigue No myalgias No headache    Past Medical History  Diagnosis Date  . Hypertension     Past Surgical History  Procedure Date  . Cholecystectomy   . Hernia repair     History reviewed. No pertinent family history.  History  Substance Use Topics  . Smoking status: Not on file  . Smokeless tobacco: Not on file  . Alcohol Use:     OB History    Grav Para Term Preterm Abortions TAB SAB Ect Mult Living                  Review of Systems  Allergies  Latex and Prednisone  Home Medications   Current Outpatient Rx  Name Route Sig Dispense Refill  . HYDROCHLOROTHIAZIDE 25 MG PO TABS Oral Take 25 mg by mouth daily.      Marland Kitchen PRAVASTATIN SODIUM 20 MG PO TABS Oral Take 20 mg by mouth daily.      . SERTRALINE HCL 100 MG PO TABS Oral Take 100 mg by mouth daily.      Marland Kitchen VERAPAMIL HCL ER 240 MG PO CP24 Oral Take 240 mg by mouth at bedtime.      Marland Kitchen LEVOFLOXACIN 750 MG PO TABS Oral Take 1 tablet (750 mg total) by mouth daily. 8 tablet 0    BP 127/74   Pulse 99  Temp(Src) 98.9 F (37.2 C) (Oral)  Resp 18  Ht 5' 5.5" (1.664 m)  Wt 193 lb (87.544 kg)  BMI 31.63 kg/m2  SpO2 93%  Physical Exam  Nursing note and vitals reviewed. Constitutional: She is oriented to person, place, and time. She appears well-developed and well-nourished.  HENT:  Head: Normocephalic and atraumatic.  Right Ear: Tympanic membrane, external ear and ear canal normal.  Left Ear: Tympanic membrane, external ear and ear canal normal.  Nose: Mucosal edema and rhinorrhea present.  Mouth/Throat: Posterior oropharyngeal erythema present. No oropharyngeal exudate or posterior oropharyngeal edema.  Eyes: No scleral icterus.  Neck: Neck supple.  Cardiovascular: Regular rhythm and normal heart sounds.   Pulmonary/Chest: Effort normal. No respiratory distress. She has rhonchi (Scattered bilateral).  Neurological: She is alert and oriented to person, place, and time.  Skin: Skin is warm and dry.  Psychiatric: She has a normal mood and affect. Her speech is normal.    ED Course  Procedures (including critical care time)  Labs Reviewed - No data to display Dg Chest 2 View  05/09/2011  *RADIOLOGY REPORT*  Clinical Data: 77 year old female with cough.  CHEST - 2 VIEW  Comparison: 02/21 to L12  Findings:  The cardiomediastinal silhouette is unremarkable. Peribronchial thickening is again identified. There are mild ill-defined opacities in the left lower lung - suspicious for pneumonia. There is no evidence of suspicious nodule, mass, pleural effusion or pneumothorax. No acute or suspicious bony abnormality.  IMPRESSION: Probable mild left lower lung pneumonia - radiographic follow up to resolution is recommended.  Original Report Authenticated By: Rosendo Gros, M.D.     1. Cough   2. Acute bronchitis       MDM  1)  Take the prescribed antibiotic as instructed.  A chest x-ray was ordered and read by the radiologist as above.  Rocephin IM 1 gram given.  Suggest follow up  Xray in a few weeks. 2)  Use nasal saline solution (over the counter) at least 3 times a day. 3)  Use over the counter decongestants like Zyrtec-D every 12 hours as needed to help with congestion.  If you have hypertension, do not take medicines with sudafed.  4)  Can take tylenol every 6 hours or motrin every 8 hours for pain or fever. 5)  Follow up with your primary doctor if no improvement in 5-7 days, sooner if increasing pain, fever, or new symptoms.     Lily Kocher, MD 05/09/11 [phone]

## 2011-05-09 NOTE — ED Notes (Signed)
Cough x 2 weeks, had flu and can't get rid of dry cough

## 2011-12-23 ENCOUNTER — Emergency Department (INDEPENDENT_AMBULATORY_CARE_PROVIDER_SITE_OTHER)
Admission: EM | Admit: 2011-12-23 | Discharge: 2011-12-23 | Disposition: A | Discharge status: Home / Self Care | Payer: Medicare Other | Source: Home / Self Care | Attending: Family Medicine | Admitting: Family Medicine

## 2011-12-23 ENCOUNTER — Encounter: Payer: Self-pay | Admitting: *Deleted

## 2011-12-23 DIAGNOSIS — N3000 Acute cystitis without hematuria: Secondary | ICD-10-CM

## 2011-12-23 DIAGNOSIS — R300 Dysuria: Secondary | ICD-10-CM

## 2011-12-23 LAB — POCT URINALYSIS DIP (MANUAL ENTRY)
Bilirubin, UA: NEGATIVE
Glucose, UA: NEGATIVE
Ketones, POC UA: NEGATIVE
Nitrite, UA: POSITIVE
Spec Grav, UA: 1.02 (ref 1.005–1.03)

## 2011-12-23 MED ORDER — CEPHALEXIN 500 MG PO CAPS: 500.0000 mg | ORAL_CAPSULE | Freq: Two times a day (BID) | ORAL | Status: AC

## 2011-12-23 NOTE — ED Notes (Signed)
Patient c/o dysuria x 1 week and urinary frequency x 1 day. No otc meds taken.

## 2011-12-23 NOTE — ED Provider Notes (Signed)
History     CSN: [id number]  Arrival date & time 12/23/11  4098   First MD Initiated Contact with Patient 12/23/11 [phone]      Chief Complaint  Patient presents with  . Dysuria      HPI Comments: Patient c/o dysuria x 1 week and urinary frequency x 1 day. No otc meds taken.   Patient is a 78 y.o. female presenting with dysuria. The history is provided by the patient.  Dysuria  This is a new problem. Episode onset: 1 week ago. The problem occurs every urination. The problem has been gradually worsening. The quality of the pain is described as burning. The pain is mild. There has been no fever. Associated symptoms include frequency, hesitancy and urgency. Pertinent negatives include no chills, no sweats, no nausea, no vomiting, no discharge, no hematuria and no flank pain. Treatments tried: cranberry juice.    Past Medical History  Diagnosis Date  . Hypertension     Past Surgical History  Procedure Date  . Cholecystectomy   . Hernia repair     History reviewed. No pertinent family history.  History  Substance Use Topics  . Smoking status: Never Smoker   . Smokeless tobacco: Not on file  . Alcohol Use: No    OB History    Grav Para Term Preterm Abortions TAB SAB Ect Mult Living                  Review of Systems  Constitutional: Negative for chills.  Gastrointestinal: Negative for nausea and vomiting.  Genitourinary: Positive for dysuria, hesitancy, urgency and frequency. Negative for hematuria and flank pain.  All other systems reviewed and are negative.    Allergies  Latex and Prednisone  Home Medications   Current Outpatient Rx  Name Route Sig Dispense Refill  . ASPIRIN 81 MG PO TABS Oral Take 81 mg by mouth daily.    . TRIAMTERENE-HCTZ 37.5-25 MG PO CAPS Oral Take 1 capsule by mouth every morning.    . CEPHALEXIN 500 MG PO CAPS Oral Take 1 capsule (500 mg total) by mouth 2 (two) times daily. 14 capsule 0  . HYDROCHLOROTHIAZIDE 25 MG PO TABS Oral Take 25  mg by mouth daily.      Marland Kitchen PRAVASTATIN SODIUM 20 MG PO TABS Oral Take 20 mg by mouth daily.      . SERTRALINE HCL 100 MG PO TABS Oral Take 100 mg by mouth daily.      Marland Kitchen VERAPAMIL HCL ER 240 MG PO CP24 Oral Take 240 mg by mouth at bedtime.        BP 127/74  Pulse 67  Temp 98.4 F (36.9 C) (Oral)  Resp 14  Ht 5' 5.5" (1.664 m)  Wt 194 lb (87.998 kg)  BMI 31.79 kg/m2  SpO2 94%  Physical Exam Nursing notes and Vital Signs reviewed. Appearance:  Patient appears stated age, and in no acute distress.  Patient is obese (BMI 31.8) Eyes:  Pupils are equal, round, and reactive to light and accomodation.  Extraocular movement is intact.  Conjunctivae are not inflamed  Mouth/Pharynx:  moist mucous membranes  Neck:  Supple.  No adenopathy Lungs:  Clear to auscultation.  Breath sounds are equal.  Heart:  Regular rate and rhythm without murmurs, rubs, or gallops.  Abdomen:  Nontender without masses or hepatosplenomegaly.  Bowel sounds are present.  No CVA or flank tenderness.  Extremities:  No edema.  Skin:  No rash present.   ED Course  Procedures  none   Labs Reviewed  POCT URINALYSIS DIP (MANUAL ENTRY) remarkable for Small blood, Positive NIT, Small leuks  URINE CULTURE pending      1. Dysuria   2. Acute cystitis       MDM  Urine culture pending.  Begin Keflex.  Increase fluid intake.  May use non-prescription AZO for two days, if desired, for urinary discomfort.   Patient had requested Cipro, but potential interactions with 3 of her meds  All new medications have been reconciled with current medications using the [employer] Drug Interactions program.  No significant interactions were revealed.  Followup with Family Doctor if not improved in one week.         Lattie Haw, MD 12/23/11 [phone]

## 2011-12-25 ENCOUNTER — Telehealth: Payer: Self-pay

## 2011-12-26 LAB — URINE CULTURE: Colony Count: >=100000

## 2011-12-27 ENCOUNTER — Telehealth: Payer: Self-pay | Admitting: *Deleted

## 2011-12-30 ENCOUNTER — Encounter: Payer: Self-pay | Admitting: Emergency Medicine

## 2011-12-30 ENCOUNTER — Emergency Department (INDEPENDENT_AMBULATORY_CARE_PROVIDER_SITE_OTHER)
Admission: EM | Admit: 2011-12-30 | Discharge: 2011-12-30 | Disposition: A | Discharge status: Home / Self Care | Payer: Medicare Other | Source: Home / Self Care

## 2011-12-30 DIAGNOSIS — N390 Urinary tract infection, site not specified: Secondary | ICD-10-CM

## 2011-12-30 LAB — POCT URINALYSIS DIP (MANUAL ENTRY)
Bilirubin, UA: NEGATIVE
Glucose, UA: NEGATIVE
Ketones, POC UA: NEGATIVE
Nitrite, UA: NEGATIVE
Protein Ur, POC: NEGATIVE
Spec Grav, UA: 1.02 (ref 1.005–1.03)
Urobilinogen, UA: 1 (ref 0–1)
pH, UA: 7 (ref 5–8)

## 2011-12-30 MED ORDER — CIPROFLOXACIN HCL 250 MG PO TABS: 250.0000 mg | ORAL_TABLET | Freq: Two times a day (BID) | ORAL | Status: AC

## 2011-12-30 NOTE — ED Notes (Addendum)
UTI finished 7 days of Keflex yesterday, dysuria started yesterday.

## 2011-12-30 NOTE — ED Provider Notes (Signed)
History     CSN: [id number]  Arrival date & time 12/30/11  1121   None     Chief Complaint  Patient presents with  . Urinary Tract Infection   This is a 78 y.o. female who presents today with UTI symptoms for  1 day. She was seen here 12/23/11 for a UTI and was treated with Keflex which she finished 1 day ago. She feels the Keflex worked to resolve UTI symptoms, however for the past 24 hours, she complains of recurrence of the same UTI symptoms.--- She states that the symptoms are moderate to severe, and she requests to be treated with Cipro which has always worked for UTIs in the past without any side effects.  + dysuria + frequency + urgency Minimal hematuria No vaginal discharge No fever/chills No lower abdominal pain No nausea No vomiting No back pain No fatigue      HPI  Past Medical History  Diagnosis Date  . Hypertension     Past Surgical History  Procedure Date  . Cholecystectomy   . Hernia repair     History reviewed. No pertinent family history.  History  Substance Use Topics  . Smoking status: Never Smoker   . Smokeless tobacco: Not on file  . Alcohol Use: No    OB History    Grav Para Term Preterm Abortions TAB SAB Ect Mult Living                  Review of Systems  All other systems reviewed and are negative.    Allergies  Latex and Prednisone  Home Medications   Current Outpatient Rx  Name Route Sig Dispense Refill  . ASPIRIN 81 MG PO TABS Oral Take 81 mg by mouth daily.    . CEPHALEXIN 500 MG PO CAPS Oral Take 1 capsule (500 mg total) by mouth 2 (two) times daily. 14 capsule 0  . CIPROFLOXACIN HCL 250 MG PO TABS Oral Take 1 tablet (250 mg total) by mouth 2 (two) times daily. 14 tablet 0  . HYDROCHLOROTHIAZIDE 25 MG PO TABS Oral Take 25 mg by mouth daily.      Marland Kitchen PRAVASTATIN SODIUM 20 MG PO TABS Oral Take 20 mg by mouth daily.      . SERTRALINE HCL 100 MG PO TABS Oral Take 100 mg by mouth daily.      . TRIAMTERENE-HCTZ 37.5-25 MG  PO CAPS Oral Take 1 capsule by mouth every morning.    Marland Kitchen VERAPAMIL HCL ER 240 MG PO CP24 Oral Take 240 mg by mouth at bedtime.        BP 120/71  Pulse 69  Temp 97.9 F (36.6 C) (Oral)  Resp 12  Ht 5' 5.5" (1.664 m)  Wt 196 lb (88.905 kg)  BMI 32.12 kg/m2  SpO2 93%  Physical Exam  Nursing note and vitals reviewed. Constitutional: She is oriented to person, place, and time. She appears well-developed and well-nourished. No distress.  HENT:  Mouth/Throat: Oropharynx is clear and moist.  Eyes: No scleral icterus.  Neck: Neck supple.  Cardiovascular: Normal rate, regular rhythm and normal heart sounds.   Pulmonary/Chest: Breath sounds normal.  Abdominal: Soft. She exhibits no mass. There is no hepatosplenomegaly. There is tenderness in the suprapubic area. There is no rebound, no guarding and no CVA tenderness.  Lymphadenopathy:    She has no cervical adenopathy.  Neurological: She is alert and oriented to person, place, and time.  Skin: Skin is warm and dry.  ED Course  Procedures (including critical care time)   Labs Reviewed  POCT URINALYSIS DIP (MANUAL ENTRY)  URINE CULTURE   No results found.   1. UTI (urinary tract infection)       MDM  I have reviewed the urine culture results from last visit of 12/23/11. It grew out both Escherichia coli and Klebsiella, greater than 100,000 colony count for each, which were pan sensitive, except the Klebsiella was resistant to ampicillin and intermediate to nitrofurantoin.--Of note, both bacteria were exquisite sensitive to Cipro, each less than 0.25  Today his urinalysis shows trace blood and small leukocytes. She likely has a recurrence of the UTI and I will treat with Cipro 250 mg twice a day x7 days because of sensitivities above, and because she has tolerated Cipro well in the past and it was effective in the past. Her questions invited and answered. Urine culture sent today. Push fluids and other symptomatic care  discussed . Followup with PCP or urologist if no better in one week, sooner if worse or new symptoms.  She voiced understanding and agreement with above plans.         Lajean Manes, MD 12/30/11 1250

## 2012-01-04 ENCOUNTER — Telehealth: Payer: Self-pay | Admitting: Emergency Medicine

## 2012-01-04 LAB — URINE CULTURE: Colony Count: >=100000

## 2012-08-07 ENCOUNTER — Encounter: Payer: Self-pay | Admitting: *Deleted

## 2012-08-07 ENCOUNTER — Emergency Department (INDEPENDENT_AMBULATORY_CARE_PROVIDER_SITE_OTHER)
Admission: EM | Admit: 2012-08-07 | Discharge: 2012-08-07 | Disposition: A | Discharge status: Home / Self Care | Payer: Medicare Other | Source: Home / Self Care | Attending: Emergency Medicine | Admitting: Emergency Medicine

## 2012-08-07 DIAGNOSIS — R300 Dysuria: Secondary | ICD-10-CM

## 2012-08-07 DIAGNOSIS — N390 Urinary tract infection, site not specified: Secondary | ICD-10-CM

## 2012-08-07 LAB — POCT URINALYSIS DIP (MANUAL ENTRY)
Bilirubin, UA: NEGATIVE
Ketones, POC UA: NEGATIVE
pH, UA: 6 (ref 5–8)

## 2012-08-07 MED ORDER — PHENAZOPYRIDINE HCL 200 MG PO TABS: 200.0000 mg | ORAL_TABLET | Freq: Three times a day (TID) | ORAL | Status: DC

## 2012-08-07 MED ORDER — CIPROFLOXACIN HCL 250 MG PO TABS: 250.0000 mg | ORAL_TABLET | Freq: Two times a day (BID) | ORAL | Status: DC

## 2012-08-07 NOTE — ED Notes (Signed)
Pt c/o dysuria and frequent urination x today. She took 1 Cipro that she had at home today. Denies fever.

## 2012-08-07 NOTE — ED Provider Notes (Signed)
History     CSN: [id number]  Arrival date & time 08/07/12  1819   First MD Initiated Contact with Patient 08/07/12 1828      Chief Complaint  Patient presents with  . Dysuria  . Urinary Frequency    (Consider location/radiation/quality/duration/timing/severity/associated sxs/prior treatment) HPI Gina Gibson is a 79 y.o. female who presents today with UTI symptoms for a few days, but worse today.   Last UTI was about 6 months ago.  She reports that Cipro works well for her. + dysuria + frequency No urgency No hematuria No vaginal discharge No fever/chills No lower abdominal pain No back pain No fatigue    Past Medical History  Diagnosis Date  . Hypertension     Past Surgical History  Procedure Laterality Date  . Cholecystectomy    . Hernia repair    . Knee surgery      Family History  Problem Relation Age of Onset  . Stroke Mother   . Stroke Father     History  Substance Use Topics  . Smoking status: Never Smoker   . Smokeless tobacco: Not on file  . Alcohol Use: No    OB History   Grav Para Term Preterm Abortions TAB SAB Ect Mult Living                  Review of Systems  All other systems reviewed and are negative.    Allergies  Latex; Prednisone; and Sulfa antibiotics  Home Medications   Current Outpatient Rx  Name  Route  Sig  Dispense  Refill  . aspirin 81 MG tablet   Oral   Take 81 mg by mouth daily.         . ciprofloxacin (CIPRO) 250 MG tablet   Oral   Take 1 tablet (250 mg total) by mouth 2 (two) times daily.   14 tablet   0   . hydrochlorothiazide (HYDRODIURIL) 25 MG tablet   Oral   Take 25 mg by mouth daily.           . phenazopyridine (PYRIDIUM) 200 MG tablet   Oral   Take 1 tablet (200 mg total) by mouth 3 (three) times daily.   6 tablet   0   . pravastatin (PRAVACHOL) 20 MG tablet   Oral   Take 20 mg by mouth daily.           . sertraline (ZOLOFT) 100 MG tablet   Oral   Take 100 mg by mouth daily.           Marland Kitchen triamterene-hydrochlorothiazide (DYAZIDE) 37.5-25 MG per capsule   Oral   Take 1 capsule by mouth every morning.         . verapamil (VERELAN PM) 240 MG 24 hr capsule   Oral   Take 240 mg by mouth at bedtime.             BP 148/70  Pulse 60  Temp(Src) 97.8 F (36.6 C) (Oral)  Ht 5' 6.5" (1.689 m)  Wt 202 lb (91.627 kg)  BMI 32.12 kg/m2  SpO2 96%  Physical Exam  Nursing note and vitals reviewed. Constitutional: She is oriented to person, place, and time. She appears well-developed and well-nourished.  HENT:  Head: Normocephalic and atraumatic.  Eyes: No scleral icterus.  Neck: Neck supple.  Cardiovascular: Regular rhythm and normal heart sounds.   Pulmonary/Chest: Effort normal and breath sounds normal. No respiratory distress.  Abdominal: Soft. Normal appearance and bowel sounds are  normal. She exhibits no mass. There is no rebound, no guarding and no CVA tenderness.  Neurological: She is alert and oriented to person, place, and time.  Skin: Skin is warm and dry.  Psychiatric: She has a normal mood and affect. Her speech is normal.    ED Course  Procedures (including critical care time)  Labs Reviewed  URINE CULTURE  POCT URINALYSIS DIP (MANUAL ENTRY)   No results found.  Results for orders placed during the hospital encounter of 08/07/12  POCT URINALYSIS DIP (MANUAL ENTRY)      Result Value Range   Color, UA yellow     Clarity, UA cloudy     Glucose, UA neg     Bilirubin, UA negative     Bilirubin, UA negative     Spec Grav, UA 1.020  1.005 - 1.03   Blood, UA trace-intact     pH, UA 6.0  5 - 8   Protein Ur, POC negative     Urobilinogen, UA 0.2  0 - 1   Nitrite, UA Negative     Leukocytes, UA large (3+)      1. Dysuria   2. Urinary tract infection, site not specified       MDM  1) Take the prescribed antibiotic as directed. 2) A urinalysis was done in clinic (see above for results).  A urine culture is pending. 3) Follow up with your PCP or  urologist if not improving or if worsening symptoms.   Two cultures done about 6 months ago grew 1) E. coli & Klebsiella and 2) Acinetobacter & Pseudomonas.  She has taken Keflex in the past which doesn't seem to rid her of her symptoms completely and prefers Cipro which does show good coverage for all 4 bacteria (all are <0.25).  She does take some meds which can interact and she is 79 yo, but I feel that she should be ok with a short course and that benefits outweigh risks.  Precautions discussed with her.  Also gave Rx for Azo to use for 1-2 days to help with the dysuria.  Hydrate, wipe front to back.   Marlaine Hind, MD 08/07/12 [phone]

## 2012-08-09 ENCOUNTER — Telehealth: Payer: Self-pay | Admitting: *Deleted

## 2012-08-09 LAB — URINE CULTURE: Colony Count: 6000

## 2012-09-22 ENCOUNTER — Emergency Department (INDEPENDENT_AMBULATORY_CARE_PROVIDER_SITE_OTHER)
Admission: EM | Admit: 2012-09-22 | Discharge: 2012-09-22 | Disposition: A | Discharge status: Home / Self Care | Payer: Medicare Other | Source: Home / Self Care | Attending: Family Medicine | Admitting: Family Medicine

## 2012-09-22 DIAGNOSIS — N3000 Acute cystitis without hematuria: Secondary | ICD-10-CM

## 2012-09-22 DIAGNOSIS — R300 Dysuria: Secondary | ICD-10-CM

## 2012-09-22 HISTORY — DX: Depression, unspecified: F32.A

## 2012-09-22 HISTORY — DX: Major depressive disorder, single episode, unspecified: F32.9

## 2012-09-22 HISTORY — DX: Insomnia, unspecified: G47.00

## 2012-09-22 LAB — POCT URINALYSIS DIP (MANUAL ENTRY)
Protein Ur, POC: 30
Spec Grav, UA: 1.025 (ref 1.005–1.03)
Urobilinogen, UA: 1 (ref 0–1)
pH, UA: 5.5 (ref 5–8)

## 2012-09-22 MED ORDER — CIPROFLOXACIN HCL 250 MG PO TABS: 250.0000 mg | ORAL_TABLET | Freq: Two times a day (BID) | ORAL | Status: DC

## 2012-09-22 NOTE — ED Provider Notes (Signed)
History     CSN: [id number]  Arrival date & time 09/22/12  1124   First MD Initiated Contact with Patient 09/22/12 1205      Chief Complaint  Patient presents with  . Dysuria       HPI Comments: Patient developed dysuria, nocturia, and urgency yesterday.  She does not feel ill otherwise.  She has a history of recurrent UTI's.  The history is provided by the patient.    Past Medical History  Diagnosis Date  . Hypertension   . Insomnia   . Depression     Past Surgical History  Procedure Laterality Date  . Cholecystectomy    . Hernia repair    . Knee surgery      Family History  Problem Relation Age of Onset  . Stroke Mother   . Stroke Father     History  Substance Use Topics  . Smoking status: Never Smoker   . Smokeless tobacco: Not on file  . Alcohol Use: No    OB History   Grav Para Term Preterm Abortions TAB SAB Ect Mult Living                  Review of Systems  Allergies  Latex; Prednisone; and Sulfa antibiotics  Home Medications   Current Outpatient Rx  Name  Route  Sig  Dispense  Refill  . aspirin 81 MG tablet   Oral   Take 81 mg by mouth daily.         . ciprofloxacin (CIPRO) 250 MG tablet   Oral   Take 1 tablet (250 mg total) by mouth 2 (two) times daily.   10 tablet   0   . hydrochlorothiazide (HYDRODIURIL) 25 MG tablet   Oral   Take 25 mg by mouth daily.           . phenazopyridine (PYRIDIUM) 200 MG tablet   Oral   Take 1 tablet (200 mg total) by mouth 3 (three) times daily.   6 tablet   0   . pravastatin (PRAVACHOL) 20 MG tablet   Oral   Take 20 mg by mouth daily.           . sertraline (ZOLOFT) 100 MG tablet   Oral   Take 100 mg by mouth daily.           Marland Kitchen triamterene-hydrochlorothiazide (DYAZIDE) 37.5-25 MG per capsule   Oral   Take 1 capsule by mouth every morning.         . verapamil (VERELAN PM) 240 MG 24 hr capsule   Oral   Take 240 mg by mouth at bedtime.             BP 135/71  Pulse 67   Temp(Src) 98.1 F (36.7 C) (Oral)  Ht 5\' 5"  (1.651 m)  Wt 203 lb (92.08 kg)  BMI 33.78 kg/m2  SpO2 93%  Physical Exam Nursing notes and Vital Signs reviewed. Appearance:  Patient appears stated age, and in no acute distress. Patient is obese (BMI 33.8) Eyes:  Pupils are equal, round, and reactive to light and accomodation.  Extraocular movement is intact.  Conjunctivae are not inflamed  Pharynx:  Normal Neck:  Supple.  No adenopathy Lungs:  Clear to auscultation.  Breath sounds are equal.  Heart:  Regular rate and rhythm without murmurs, rubs, or gallops.  Abdomen:  Nontender without masses or hepatosplenomegaly.  Bowel sounds are present.  No CVA or flank tenderness.  Extremities:  No  edema.  No calf tenderness Skin:  No rash present.   ED Course  Procedures (including critical care time)  Labs Reviewed  URINE CULTURE pending  POCT URINALYSIS DIP (MANUAL ENTRY) BLO moderate; PRO 30mg /dL; LEU large      1. Dysuria   2. Acute cystitis, recurrent       MDM  Urine culture pending.  Begin low dose Cipro for 5 days. Increase fluid intake.  May continue Pyridium for two days. With her history of recurrent UTI's, recommend followup with urologist         Lattie Haw, MD 09/24/12 1131

## 2012-09-22 NOTE — ED Notes (Signed)
Painful urination started yesterday; hx of UTI's

## 2012-09-24 LAB — URINE CULTURE: Colony Count: 65000

## 2012-09-27 ENCOUNTER — Telehealth: Payer: Self-pay | Admitting: *Deleted

## 2013-07-26 ENCOUNTER — Encounter: Payer: Self-pay | Admitting: Emergency Medicine

## 2013-07-26 ENCOUNTER — Emergency Department (INDEPENDENT_AMBULATORY_CARE_PROVIDER_SITE_OTHER)
Admission: EM | Admit: 2013-07-26 | Discharge: 2013-07-26 | Disposition: A | Discharge status: Home / Self Care | Payer: Medicare Other | Source: Home / Self Care | Attending: Family Medicine | Admitting: Family Medicine

## 2013-07-26 DIAGNOSIS — N3000 Acute cystitis without hematuria: Secondary | ICD-10-CM

## 2013-07-26 DIAGNOSIS — R300 Dysuria: Secondary | ICD-10-CM

## 2013-07-26 LAB — POCT URINALYSIS DIP (MANUAL ENTRY)
BILIRUBIN UA: NEGATIVE
GLUCOSE UA: NEGATIVE
Ketones, POC UA: NEGATIVE
NITRITE UA: POSITIVE
Protein Ur, POC: NEGATIVE
Spec Grav, UA: 1.025 (ref 1.005–1.03)
UROBILINOGEN UA: 0.2 (ref 0–1)
pH, UA: 5.5 (ref 5–8)

## 2013-07-26 MED ORDER — CIPROFLOXACIN HCL 250 MG PO TABS: 250.0000 mg | ORAL_TABLET | Freq: Two times a day (BID) | ORAL | Status: DC

## 2013-07-26 NOTE — Discharge Instructions (Signed)
Increase fluid intake.  May continue Pyridium for two days. If symptoms become significantly worse during the night or over the weekend, proceed to the local emergency room.

## 2013-07-26 NOTE — ED Provider Notes (Signed)
CSN: [id number]     Arrival date & time 07/26/13  1658 History   First MD Initiated Contact with Patient 07/26/13 1846     Chief Complaint  Patient presents with  . Urinary Tract Infection      HPI Comments: Patient awoke this morning with hesitancy and dysuria.  She feels well otherwise.  Patient is a 80 y.o. female presenting with dysuria. The history is provided by the patient.  Dysuria Pain quality:  Burning Pain severity:  Mild Onset quality:  Sudden Duration:  11 hours Timing:  Constant Progression:  Worsening Chronicity:  Recurrent Recent urinary tract infections: yes   Relieved by:  Nothing Worsened by:  Nothing tried Ineffective treatments:  None tried Urinary symptoms: frequent urination and hesitancy   Urinary symptoms: no discolored urine, no foul-smelling urine, no hematuria and no bladder incontinence   Associated symptoms: no abdominal pain, no fever, no flank pain, no genital lesions, no nausea and no vomiting   Risk factors: recurrent urinary tract infections     Past Medical History  Diagnosis Date  . Hypertension   . Insomnia   . Depression    Past Surgical History  Procedure Laterality Date  . Cholecystectomy    . Hernia repair    . Knee surgery     Family History  Problem Relation Age of Onset  . Stroke Mother   . Stroke Father    History  Substance Use Topics  . Smoking status: Never Smoker   . Smokeless tobacco: Not on file  . Alcohol Use: No   OB History   Grav Para Term Preterm Abortions TAB SAB Ect Mult Living                 Review of Systems  Constitutional: Negative for fever.  Gastrointestinal: Negative for nausea, vomiting and abdominal pain.  Genitourinary: Positive for dysuria. Negative for flank pain.  All other systems reviewed and are negative.    Allergies  Latex; Prednisone; and Sulfa antibiotics  Home Medications   Current Outpatient Rx  Name  Route  Sig  Dispense  Refill  . aspirin 81 MG tablet   Oral  Take 81 mg by mouth daily.         . ciprofloxacin (CIPRO) 250 MG tablet   Oral   Take 1 tablet (250 mg total) by mouth 2 (two) times daily.   10 tablet   0   . hydrochlorothiazide (HYDRODIURIL) 25 MG tablet   Oral   Take 25 mg by mouth daily.           . phenazopyridine (PYRIDIUM) 200 MG tablet   Oral   Take 1 tablet (200 mg total) by mouth 3 (three) times daily.   6 tablet   0   . pravastatin (PRAVACHOL) 20 MG tablet   Oral   Take 20 mg by mouth daily.           . sertraline (ZOLOFT) 100 MG tablet   Oral   Take 100 mg by mouth daily.           Marland Kitchen triamterene-hydrochlorothiazide (DYAZIDE) 37.5-25 MG per capsule   Oral   Take 1 capsule by mouth every morning.         . verapamil (VERELAN PM) 240 MG 24 hr capsule   Oral   Take 240 mg by mouth at bedtime.            BP 118/67  Pulse 70  Temp(Src) 98.1 F (36.7  C) (Oral)  Ht 5\' 5"  (1.651 m)  Wt 193 lb (87.544 kg)  BMI 32.12 kg/m2  SpO2 96% Physical Exam Nursing notes and Vital Signs reviewed. Appearance:  Patient appears stated age, and in no acute distress.  Patient is obese (BMI 32.1) Eyes:  Pupils are equal, round, and reactive to light and accomodation.  Extraocular movement is intact.  Conjunctivae are not inflamed  Pharynx:  Normal; moist mucous membranes  Neck:  Supple.  No adenopathy Lungs:  Clear to auscultation.  Breath sounds are equal.  Heart:  Regular rate and rhythm without murmurs, rubs, or gallops.  Abdomen:   Mild tenderness over bladder without masses or hepatosplenomegaly.  Bowel sounds are present.  No CVA or flank tenderness.  Extremities:  No edema.  No calf tenderness Skin:  No rash present.   ED Course  Procedures  none    Labs Reviewed  POCT URINALYSIS DIP (MANUAL ENTRY):  BLO trace intact; NIT positive; LEU moderate        MDM   1. Dysuria   2. Acute cystitis    Urine culture pending.  Begin Cipro (she notes that Cipro has been most effective in the past, and she  has had no adverse effects from Cipro) Increase fluid intake.  May continue Pyridium for two days. If symptoms become significantly worse during the night or over the weekend, proceed to the local emergency room. Followup with Family Doctor if not improved in five days.    Lattie HawStephen A Beese, MD 07/27/13 1114

## 2013-07-26 NOTE — ED Notes (Signed)
Dysuria, polyuria started this morning

## 2013-07-29 ENCOUNTER — Telehealth: Payer: Self-pay | Admitting: *Deleted

## 2013-07-29 LAB — URINE CULTURE

## 2014-01-10 ENCOUNTER — Emergency Department (INDEPENDENT_AMBULATORY_CARE_PROVIDER_SITE_OTHER): Payer: Medicare Other

## 2014-01-10 ENCOUNTER — Emergency Department (INDEPENDENT_AMBULATORY_CARE_PROVIDER_SITE_OTHER)
Admission: EM | Admit: 2014-01-10 | Discharge: 2014-01-10 | Disposition: A | Discharge status: Home / Self Care | Payer: Medicare Other | Source: Home / Self Care | Attending: Family Medicine | Admitting: Family Medicine

## 2014-01-10 ENCOUNTER — Encounter: Payer: Self-pay | Admitting: Emergency Medicine

## 2014-01-10 DIAGNOSIS — R0602 Shortness of breath: Secondary | ICD-10-CM

## 2014-01-10 DIAGNOSIS — B9789 Other viral agents as the cause of diseases classified elsewhere: Principal | ICD-10-CM

## 2014-01-10 DIAGNOSIS — R05 Cough: Secondary | ICD-10-CM

## 2014-01-10 DIAGNOSIS — J069 Acute upper respiratory infection, unspecified: Secondary | ICD-10-CM

## 2014-01-10 DIAGNOSIS — R059 Cough, unspecified: Secondary | ICD-10-CM

## 2014-01-10 MED ORDER — AMOXICILLIN 875 MG PO TABS: 875.0000 mg | ORAL_TABLET | Freq: Two times a day (BID) | ORAL | Status: DC

## 2014-01-10 MED ORDER — BENZONATATE 200 MG PO CAPS: 200.0000 mg | ORAL_CAPSULE | Freq: Every day | ORAL | Status: DC

## 2014-01-10 NOTE — Discharge Instructions (Signed)
Take plain Mucinex (1200 mg guaifenesin) twice daily for cough and congestion. Increase fluid intake, rest. May use Afrin nasal spray (or generic oxymetazoline) twice daily for about 5 days.  Also recommend using saline nasal spray several times daily and saline nasal irrigation (AYR is a common brand) Try warm salt water gargles for sore throat.  Stop all antihistamines for now, and other non-prescription cough/cold preparations. May take Ibuprofen , 4 tabs every 8 hours with food for chest chest discomfort.   Follow-up with family doctor if not improving 7 to 10 days.

## 2014-01-10 NOTE — ED Notes (Signed)
Gibson reports cleaning out a dusty storage space 2 weeks ago, since she c/o cough that is productive at times and sneezing, sinus drainage. No fever. She has taken Tessalon with relief. She also c/o low back pain with movement x 1 week.

## 2014-01-10 NOTE — ED Provider Notes (Signed)
CSN: [id number]     Arrival date & time 01/10/14  1191 History   First MD Initiated Contact with Patient 01/10/14 [phone]     Chief Complaint  Patient presents with  . Cough  . Back Pain      HPI Comments: Patient cleaned out a dusty storage space two weeks ago and afterwards developed sneezing, sinus congestion and scratchy throat.  About 3 days later she developed a non-productive cough that has persisted.  She has now developed pleuritic pain in her left lateral inferior chest.  No shortness of breath or wheezing.  No fevers, chills, and sweats. She had pneumonia about 2 to 3 years ago.  She has had Pneumovax immunization.  The history is provided by the patient.    Past Medical History  Diagnosis Date  . Hypertension   . Insomnia   . Depression    Past Surgical History  Procedure Laterality Date  . Cholecystectomy    . Hernia repair    . Knee surgery     Family History  Problem Relation Age of Onset  . Stroke Mother   . Stroke Father    History  Substance Use Topics  . Smoking status: Never Smoker   . Smokeless tobacco: Never Used  . Alcohol Use: No   OB History   Grav Para Term Preterm Abortions TAB SAB Ect Mult Living                 Review of Systems + sore throat + cough + pleuritic pain left lateral chest No wheezing + nasal congestion + post-nasal drainage No sinus pain/pressure No itchy/red eyes No earache No hemoptysis No SOB No fever/chills No nausea No vomiting No abdominal pain No diarrhea No urinary symptoms No skin rash + fatigue No myalgias No headache Used OTC meds without relief  Allergies  Latex; Prednisone; and Sulfa antibiotics  Home Medications   Prior to Admission medications   Medication Sig Start Date End Date Taking? Authorizing Provider  amoxicillin (AMOXIL) 875 MG tablet Take 1 tablet (875 mg total) by mouth 2 (two) times daily. 01/10/14   Lattie Haw, MD  aspirin 81 MG tablet Take 81 mg by mouth daily.    Historical  Provider, MD  benzonatate (TESSALON) 200 MG capsule Take 1 capsule (200 mg total) by mouth at bedtime. Take as needed for cough 01/10/14   Lattie Haw, MD  hydrochlorothiazide (HYDRODIURIL) 25 MG tablet Take 25 mg by mouth daily.      Historical Provider, MD  pravastatin (PRAVACHOL) 20 MG tablet Take 20 mg by mouth daily.      Historical Provider, MD  sertraline (ZOLOFT) 100 MG tablet Take 100 mg by mouth daily.      Historical Provider, MD  triamterene-hydrochlorothiazide (DYAZIDE) 37.5-25 MG per capsule Take 1 capsule by mouth every morning.    Historical Provider, MD  verapamil (VERELAN PM) 240 MG 24 hr capsule Take 240 mg by mouth at bedtime.      Historical Provider, MD   BP 130/67  Pulse 66  Temp(Src) 98 F (36.7 C) (Oral)  Resp 16  Wt 200 lb (90.719 kg)  SpO2 94% Physical Exam Nursing notes and Vital Signs reviewed. Appearance:  Patient appears stated age, and in no acute distress.  Patient is obese Eyes:  Pupils are equal, round, and reactive to light and accomodation.  Extraocular movement is intact.  Conjunctivae are not inflamed  Ears:  Canals normal.  Tympanic membranes normal.  Nose:  Mildly congested turbinates.  No sinus tenderness.   Pharynx:  Normal Neck:  Supple.   Tender enlarged posterior nodes are palpated bilaterally  Lungs:  Clear to auscultation.  Breath sounds are equal.  Chest:  Tenderness left lateral inferior ribs Heart:  Regular rate and rhythm without murmurs, rubs, or gallops.  Abdomen:  Nontender without masses or hepatosplenomegaly.  Bowel sounds are present.  No CVA or flank tenderness.  Extremities:  No edema.  No calf tenderness Skin:  No rash present.    ED Course  Procedures  none     Imaging Review Dg Chest 2 View  01/10/2014   CLINICAL DATA:  Cough and shortness of breath for 2 weeks, nonsmoker  EXAM: CHEST  2 VIEW  COMPARISON:  05/09/2011  FINDINGS: The heart size and vascular pattern are normal. Lungs are clear except for mild bibasilar  scarring. No consolidation or effusion.  IMPRESSION: No active cardiopulmonary disease.   Electronically Signed   By: Esperanza Heir M.D.   On: 01/10/2014 09:53     MDM   1. Viral URI with cough     With a prolonged viral URI and past history of pneumonia, will begin amoxicillin.  Prescription written for Benzonatate [facility]) to take at bedtime for night-time cough.  Take plain Mucinex (1200 mg guaifenesin) twice daily for cough and congestion. Increase fluid intake, rest. May use Afrin nasal spray (or generic oxymetazoline) twice daily for about 5 days.  Also recommend using saline nasal spray several times daily and saline nasal irrigation (AYR is a common brand) Try warm salt water gargles for sore throat.  Stop all antihistamines for now, and other non-prescription cough/cold preparations. May take Ibuprofen , 4 tabs every 8 hours with food for chest chest discomfort. Recommend flu shot when well  Follow-up with family doctor if not improving 7 to 10 days.     Lattie Haw, MD 01/10/14 1018

## 2014-01-20 ENCOUNTER — Encounter: Payer: Self-pay | Admitting: Emergency Medicine

## 2014-01-20 ENCOUNTER — Emergency Department (INDEPENDENT_AMBULATORY_CARE_PROVIDER_SITE_OTHER)
Admission: EM | Admit: 2014-01-20 | Discharge: 2014-01-20 | Disposition: A | Discharge status: Home / Self Care | Payer: Medicare Other | Source: Home / Self Care

## 2014-01-20 DIAGNOSIS — N3000 Acute cystitis without hematuria: Secondary | ICD-10-CM | POA: Diagnosis not present

## 2014-01-20 LAB — POCT URINALYSIS DIP (MANUAL ENTRY)
Bilirubin, UA: NEGATIVE
GLUCOSE UA: NEGATIVE
Ketones, POC UA: NEGATIVE
Nitrite, UA: NEGATIVE
PH UA: 5.5 (ref 5–8)
Protein Ur, POC: 30
SPEC GRAV UA: 1.015 (ref 1.005–1.03)
UROBILINOGEN UA: 0.2 (ref 0–1)

## 2014-01-20 MED ORDER — CIPROFLOXACIN HCL 500 MG PO TABS: 500.0000 mg | ORAL_TABLET | Freq: Two times a day (BID) | ORAL | Status: DC

## 2014-01-20 NOTE — Discharge Instructions (Signed)

## 2014-01-20 NOTE — ED Provider Notes (Signed)
CSN: [id number]     Arrival date & time 01/20/14  1100 History   None    Chief Complaint  Patient presents with  . Urinary Tract Infection   (Consider location/radiation/quality/duration/timing/severity/associated sxs/prior Treatment) Patient is a 80 y.o. female presenting with urinary tract infection. The history is provided by the patient. No language interpreter was used.  Urinary Tract Infection This is a new problem. The problem occurs constantly. The problem has been gradually worsening. Associated symptoms include shortness of breath. Pertinent negatives include no abdominal pain. Nothing aggravates the symptoms. Nothing relieves the symptoms. She has tried nothing for the symptoms. The treatment provided no relief.  Pt reports burning with urionation  Past Medical History  Diagnosis Date  . Hypertension   . Insomnia   . Depression    Past Surgical History  Procedure Laterality Date  . Cholecystectomy    . Hernia repair    . Knee surgery     Family History  Problem Relation Age of Onset  . Stroke Mother   . Stroke Father    History  Substance Use Topics  . Smoking status: Never Smoker   . Smokeless tobacco: Never Used  . Alcohol Use: No   OB History   Grav Para Term Preterm Abortions TAB SAB Ect Mult Living                 Review of Systems  Respiratory: Positive for shortness of breath.   Gastrointestinal: Negative for abdominal pain.  All other systems reviewed and are negative.   Allergies  Latex; Prednisone; and Sulfa antibiotics  Home Medications   Prior to Admission medications   Medication Sig Start Date End Date Taking? Authorizing Provider  amoxicillin (AMOXIL) 875 MG tablet Take 1 tablet (875 mg total) by mouth 2 (two) times daily. 01/10/14   Lattie Haw, MD  aspirin 81 MG tablet Take 81 mg by mouth daily.    Historical Provider, MD  benzonatate (TESSALON) 200 MG capsule Take 1 capsule (200 mg total) by mouth at bedtime. Take as needed for  cough 01/10/14   Lattie Haw, MD  ciprofloxacin (CIPRO) 500 MG tablet Take 1 tablet (500 mg total) by mouth 2 (two) times daily. 01/20/14   Elson Areas, PA-C  hydrochlorothiazide (HYDRODIURIL) 25 MG tablet Take 25 mg by mouth daily.      Historical Provider, MD  pravastatin (PRAVACHOL) 20 MG tablet Take 20 mg by mouth daily.      Historical Provider, MD  sertraline (ZOLOFT) 100 MG tablet Take 100 mg by mouth daily.      Historical Provider, MD  triamterene-hydrochlorothiazide (DYAZIDE) 37.5-25 MG per capsule Take 1 capsule by mouth every morning.    Historical Provider, MD  verapamil (VERELAN PM) 240 MG 24 hr capsule Take 240 mg by mouth at bedtime.      Historical Provider, MD   BP 151/78  Pulse 64  Temp(Src) 98.1 F (36.7 C) (Oral)  Resp 16  Wt 193 lb (87.544 kg)  SpO2 95% Physical Exam  Nursing note and vitals reviewed. Constitutional: She is oriented to person, place, and time. She appears well-developed and well-nourished.  HENT:  Head: Normocephalic.  Eyes: EOM are normal. Pupils are equal, round, and reactive to light.  Neck: Normal range of motion.  Cardiovascular: Normal rate and normal heart sounds.   Pulmonary/Chest: Effort normal.  Abdominal: Soft. She exhibits no distension.  Musculoskeletal: Normal range of motion.  Neurological: She is alert and oriented to person,  place, and time.  Skin: Skin is warm.  Psychiatric: She has a normal mood and affect.    ED Course  Procedures (including critical care time) Labs Review Labs Reviewed  URINE CULTURE  POCT URINALYSIS DIP (MANUAL ENTRY)    Imaging Review No results found.   MDM   1. Acute cystitis without hematuria    Pt request cipro.  Pt given rx for cipro.  I will culture to insure coverage    Elson Areas, PA-C 01/20/14 1135

## 2014-01-20 NOTE — ED Notes (Signed)
Brittania c/o dysuria and polyuria x this am. Denies fever. No otc meds taken.

## 2014-01-22 LAB — URINE CULTURE

## 2014-01-23 ENCOUNTER — Telehealth: Payer: Self-pay | Admitting: Emergency Medicine

## 2014-01-23 NOTE — ED Provider Notes (Signed)
Medical history/examination/treatment/procedure(s) were performed by non-physician provider and as supervising physician I was immediately available for consultation/collaboration.   Lajean Manes, MD 01/23/14 1135

## 2014-03-20 ENCOUNTER — Emergency Department (INDEPENDENT_AMBULATORY_CARE_PROVIDER_SITE_OTHER)
Admission: EM | Admit: 2014-03-20 | Discharge: 2014-03-20 | Disposition: A | Discharge status: Home / Self Care | Payer: Medicare Other | Source: Home / Self Care | Attending: Emergency Medicine | Admitting: Emergency Medicine

## 2014-03-20 ENCOUNTER — Encounter: Payer: Self-pay | Admitting: Emergency Medicine

## 2014-03-20 DIAGNOSIS — J209 Acute bronchitis, unspecified: Secondary | ICD-10-CM

## 2014-03-20 DIAGNOSIS — J0101 Acute recurrent maxillary sinusitis: Secondary | ICD-10-CM

## 2014-03-20 MED ORDER — CEFDINIR 300 MG PO CAPS: 300.0000 mg | ORAL_CAPSULE | Freq: Two times a day (BID) | ORAL | Status: DC

## 2014-03-20 MED ORDER — HYDROCOD POLST-CHLORPHEN POLST 10-8 MG/5ML PO LQCR: 5.0000 mL | Freq: Every evening | ORAL | Status: DC | PRN

## 2014-03-20 MED ORDER — FLUTICASONE PROPIONATE 50 MCG/ACT NA SUSP: NASAL | Status: AC

## 2014-03-20 NOTE — ED Notes (Signed)
Cough x one week with congestion.

## 2014-03-20 NOTE — ED Provider Notes (Signed)
CSN: [id number]     Arrival date & time 03/20/14  1042 History   First MD Initiated Contact with Patient 03/20/14 1115     Chief Complaint  Patient presents with  . Cough   (Consider location/radiation/quality/duration/timing/severity/associated sxs/prior Treatment) HPI SINUSITIS  Onset: 3-4 days Facial/sinus pressure with discolored nasal mucus.    Severity: moderate Tried OTC meds without significant relief.  Symptoms:  + Fever  + URI prodrome with nasal congestion + Minimal swollen neck glands + mild Sinus Headache + mild ear pressure  No Allergy symptoms No significant Sore Throat No eye symptoms     + Cough No chest pain No shortness of breath  No wheezing  No Abdominal Pain No Nausea No Vomiting No diarrhea  No Myalgias No focal neurologic symptoms No syncope No Rash  No Urinary symptoms          Past Medical History  Diagnosis Date  . Hypertension   . Insomnia   . Depression    Past Surgical History  Procedure Laterality Date  . Cholecystectomy    . Hernia repair    . Knee surgery     Family History  Problem Relation Age of Onset  . Stroke Mother   . Stroke Father    History  Substance Use Topics  . Smoking status: Never Smoker   . Smokeless tobacco: Never Used  . Alcohol Use: No   OB History    No data available     Review of Systems  All other systems reviewed and are negative.   Allergies  Latex; Prednisone; and Sulfa antibiotics  Home Medications   Prior to Admission medications   Medication Sig Start Date End Date Taking? Authorizing Provider  amoxicillin (AMOXIL) 875 MG tablet Take 1 tablet (875 mg total) by mouth 2 (two) times daily. 01/10/14   Lattie HawStephen A Beese, MD  aspirin 81 MG tablet Take 81 mg by mouth daily.    Historical Provider, MD  benzonatate (TESSALON) 200 MG capsule Take 1 capsule (200 mg total) by mouth at bedtime. Take as needed for cough 01/10/14   Lattie HawStephen A Beese, MD  cefdinir (OMNICEF) 300 MG  capsule Take 1 capsule (300 mg total) by mouth 2 (two) times daily. X 10 days 03/20/14   Lajean Manesavid Massey, MD  chlorpheniramine-HYDROcodone [facility](TUSSIONEX PENNKINETIC ER) 10-8 MG/5ML [facility] Take 5 mLs by mouth at bedtime as needed for cough. For cough. Caution: May cause drowsiness 03/20/14   Lajean Manesavid Massey, MD  ciprofloxacin (CIPRO) 500 MG tablet Take 1 tablet (500 mg total) by mouth 2 (two) times daily. 01/20/14   Elson AreasLeslie K Sofia, PA-C  fluticasone [facility](FLONASE) 50 MCG/ACT nasal spray 1 or 2 sprays each nostril twice a day 03/20/14   Lajean Manesavid Massey, MD  hydrochlorothiazide (HYDRODIURIL) 25 MG tablet Take 25 mg by mouth daily.      Historical Provider, MD  pravastatin (PRAVACHOL) 20 MG tablet Take 20 mg by mouth daily.      Historical Provider, MD  sertraline (ZOLOFT) 100 MG tablet Take 100 mg by mouth daily.      Historical Provider, MD  triamterene-hydrochlorothiazide (DYAZIDE) 37.5-25 MG per capsule Take 1 capsule by mouth every morning.    Historical Provider, MD  verapamil (VERELAN PM) 240 MG 24 hr capsule Take 240 mg by mouth at bedtime.      Historical Provider, MD   BP 136/75 mmHg  Pulse 64  Temp(Src) 97.9 F (36.6 C) (Oral)  Resp 16  Ht 5\' 5"  (1.651 m)  Wt 196 lb (88.905 kg)  BMI 32.62 kg/m2  SpO2 95% Physical Exam  Constitutional: She is oriented to person, place, and time. She appears well-developed and well-nourished. No distress.  HENT:  Head: Normocephalic and atraumatic.  Right Ear: Tympanic membrane, external ear and ear canal normal.  Left Ear: Tympanic membrane, external ear and ear canal normal.  Nose: Mucosal edema and rhinorrhea present. Right sinus exhibits maxillary sinus tenderness. Left sinus exhibits maxillary sinus tenderness.  Mouth/Throat: Oropharynx is clear and moist. No oral lesions. No oropharyngeal exudate.  Eyes: Right eye exhibits no discharge. Left eye exhibits no discharge. No scleral icterus.  Neck: Neck supple.  Cardiovascular: Normal rate, regular rhythm and normal  heart sounds.   Pulmonary/Chest: Effort normal and breath sounds normal. She has no wheezes. She has no rales.  Lymphadenopathy:    She has no cervical adenopathy.  Neurological: She is alert and oriented to person, place, and time.  Skin: Skin is warm and dry.  Nursing note and vitals reviewed.   ED Course  Procedures (including critical care time) Labs Review Labs Reviewed - No data to display  Imaging Review No results found.   MDM   1. Acute recurrent maxillary sinusitis    Treatment options discussed, as well as risks, benefits, alternatives. Patient voiced understanding and agreement with the following plans:   New Prescriptions   CEFDINIR (OMNICEF) 300 MG CAPSULE    Take 1 capsule (300 mg total) by mouth 2 (two) times daily. X 10 days   CHLORPHENIRAMINE-HYDROCODONE (TUSSIONEX PENNKINETIC ER) 10-8 MG/5ML LQCR    Take 5 mLs by mouth at bedtime as needed for cough. For cough. Caution: May cause drowsiness   FLUTICASONE (FLONASE) 50 MCG/ACT NASAL SPRAY    1 or 2 sprays each nostril twice a day  other symptomatic care discussed. Follow-up with your primary care doctor in 5-7 days if not improving, or sooner if symptoms become worse. Precautions discussed. Red flags discussed. Questions invited and answered. Patient voiced understanding and agreement.     Lajean Manesavid Massey, MD 03/21/14 [phone]

## 2014-04-24 ENCOUNTER — Emergency Department (INDEPENDENT_AMBULATORY_CARE_PROVIDER_SITE_OTHER): Payer: Medicare Other

## 2014-04-24 ENCOUNTER — Emergency Department
Admission: EM | Admit: 2014-04-24 | Discharge: 2014-04-24 | Disposition: A | Discharge status: Home / Self Care | Payer: Medicare Other | Source: Home / Self Care | Attending: Emergency Medicine | Admitting: Emergency Medicine

## 2014-04-24 DIAGNOSIS — W19XXXA Unspecified fall, initial encounter: Secondary | ICD-10-CM

## 2014-04-24 DIAGNOSIS — S20212A Contusion of left front wall of thorax, initial encounter: Secondary | ICD-10-CM

## 2014-04-24 DIAGNOSIS — J9811 Atelectasis: Secondary | ICD-10-CM

## 2014-04-24 MED ORDER — HYDROCODONE-ACETAMINOPHEN 5-325 MG PO TABS: 1.0000 | ORAL_TABLET | ORAL | Status: DC | PRN

## 2014-04-24 NOTE — ED Provider Notes (Signed)
CSN: [id number]     Arrival date & time 04/24/14  [id number] History   First MD Initiated Contact with Patient 04/24/14 1014     Chief Complaint  Patient presents with  . Fall    Left side rib pain.    (Consider location/radiation/quality/duration/timing/severity/associated sxs/prior Treatment) Patient is a 80 y.o. female presenting with chest pain. The history is provided by the patient. No language interpreter was used.  Chest Pain Pain location:  L chest Pain quality: aching   Pain radiates to:  Does not radiate Pain radiates to the back: no   Pain severity:  Moderate Onset quality:  Gradual Timing:  Constant Progression:  Worsening Chronicity:  New Context: breathing   Relieved by:  Nothing Worsened by:  Nothing tried Ineffective treatments:  None tried Associated symptoms: no back pain     Past Medical History  Diagnosis Date  . Hypertension   . Insomnia   . Depression    Past Surgical History  Procedure Laterality Date  . Cholecystectomy    . Hernia repair    . Knee surgery     Family History  Problem Relation Age of Onset  . Stroke Mother   . Stroke Father    History  Substance Use Topics  . Smoking status: Former Games developermoker  . Smokeless tobacco: Never Used  . Alcohol Use: No   OB History    No data available     Review of Systems  Cardiovascular: Positive for chest pain.  Musculoskeletal: Negative for back pain.  All other systems reviewed and are negative.   Allergies  Latex; Prednisone; and Sulfa antibiotics  Home Medications   Prior to Admission medications   Medication Sig Start Date End Date Taking? Authorizing Provider  fluticasone (FLONASE) 50 MCG/ACT nasal spray 1 or 2 sprays each nostril twice a day 03/20/14  Yes Lajean Manesavid Massey, MD  hydrochlorothiazide (HYDRODIURIL) 25 MG tablet Take 25 mg by mouth daily.     Yes Historical Provider, MD  pravastatin (PRAVACHOL) 20 MG tablet Take 20 mg by mouth daily.     Yes Historical Provider, MD   sertraline (ZOLOFT) 100 MG tablet Take 100 mg by mouth daily.     Yes Historical Provider, MD  triamterene-hydrochlorothiazide (DYAZIDE) 37.5-25 MG per capsule Take 1 capsule by mouth every morning.   Yes Historical Provider, MD  verapamil (VERELAN PM) 240 MG 24 hr capsule Take 240 mg by mouth at bedtime.     Yes Historical Provider, MD  amoxicillin (AMOXIL) 875 MG tablet Take 1 tablet (875 mg total) by mouth 2 (two) times daily. 01/10/14   Lattie HawStephen A Beese, MD  aspirin 81 MG tablet Take 81 mg by mouth daily.    Historical Provider, MD  benzonatate (TESSALON) 200 MG capsule Take 1 capsule (200 mg total) by mouth at bedtime. Take as needed for cough 01/10/14   Lattie HawStephen A Beese, MD  cefdinir (OMNICEF) 300 MG capsule Take 1 capsule (300 mg total) by mouth 2 (two) times daily. X 10 days 03/20/14   Lajean Manesavid Massey, MD  chlorpheniramine-HYDROcodone [facility](TUSSIONEX PENNKINETIC ER) 10[facility]8 MG[facility]5ML [facility] Take 5 mLs by mouth at bedtime as needed for cough. For cough. Caution: May cause drowsiness 03/20/14   Lajean Manesavid Massey, MD  ciprofloxacin (CIPRO) 500 MG tablet Take 1 tablet (500 mg total) by mouth 2 (two) times daily. 01/20/14   Elson AreasLeslie K Ha Placeres, PA-C   BP 151/78 mmHg  Pulse 64  Temp(Src) 98.1 F (36.7 C) (Oral)  Ht 5' 5.25" (1.657 m)  Wt 198 lb (89.812 kg)  BMI 32.71 kg/m2  SpO2 96% Physical Exam  Constitutional: She appears well-developed and well-nourished.  HENT:  Head: Normocephalic.  Right Ear: External ear normal.  Eyes: Pupils are equal, round, and reactive to light.  Neck: Normal range of motion.  Pulmonary/Chest: Effort normal. She exhibits tenderness.  Abdominal: Soft.  Musculoskeletal: Normal range of motion.  Neurological: She is alert.  Skin: Skin is warm.  Nursing note and vitals reviewed.   ED Course  Procedures (including critical care time) Labs Review Labs Reviewed - No data to display  Imaging Review Dg Ribs Unilateral W/chest Left  04/24/2014   CLINICAL DATA:  Left-sided pain 1 day  after fall  EXAM: LEFT RIBS AND CHEST - 3+ VIEW  COMPARISON:  Chest radiograph January 10, 2014  FINDINGS: Frontal chest as well as oblique and cone-down lower rib images were obtained. There is mild atelectatic change in the left base. There is slight scarring in the right base. Lungs elsewhere clear. Heart size and pulmonary vascularity are normal. No adenopathy. There is no pneumothorax or effusion. There is no demonstrable rib fracture.  IMPRESSION: Mild left base atelectasis. No pneumothorax or effusion. No rib fracture apparent.   Electronically Signed   By: Bretta BangWilliam  Woodruff M.D.   On: 04/24/2014 10:22     MDM   1. Fall, initial encounter   2. Fall   3. Contusion, chest wall, left, initial encounter    rx for hydrocodone for pain AVs See your Physicain for recheck in 3-4 days.   Elson AreasLeslie K Lucyann Romano, PA-C 04/24/14 1134

## 2014-04-24 NOTE — ED Notes (Signed)
Gina Gibson fell in her driveway yesterday. She complains of left side rib pain. The pain was a 10/10 last night. Today her pain level is 5/10. She did have a hydrocodone left over from 2009; pain resolved after taking the hydrocodone.

## 2014-04-24 NOTE — Discharge Instructions (Signed)
Blunt Chest Trauma °Blunt chest trauma is an injury caused by a blow to the chest. These chest injuries can be very painful. Blunt chest trauma often results in bruised or broken (fractured) ribs. Most cases of bruised and fractured ribs from blunt chest traumas get better after 1 to 3 weeks of rest and pain medicine. Often, the soft tissue in the chest wall is also injured, causing pain and bruising. Internal organs, such as the heart and lungs, may also be injured. Blunt chest trauma can lead to serious medical problems. This injury requires immediate medical care. °CAUSES  °· Motor vehicle collisions. °· Falls. °· Physical violence. °· Sports injuries. °SYMPTOMS  °· Chest pain. The pain may be worse when you move or breathe deeply. °· Shortness of breath. °· Lightheadedness. °· Bruising. °· Tenderness. °· Swelling. °DIAGNOSIS  °Your caregiver will do a physical exam. X-rays may be taken to look for fractures. However, minor rib fractures may not show up on X-rays until a few days after the injury. If a more serious injury is suspected, further imaging tests may be done. This may include ultrasounds, computed tomography (CT) scans, or magnetic resonance imaging (MRI). °TREATMENT  °Treatment depends on the severity of your injury. Your caregiver may prescribe pain medicines and deep breathing exercises. °HOME CARE INSTRUCTIONS °· Limit your activities until you can move around without much pain. °· Do not do any strenuous work until your injury is healed. °· Put ice on the injured area. °¨ Put ice in a plastic bag. °¨ Place a towel between your skin and the bag. °¨ Leave the ice on for 15-20 minutes, 03-04 times a day. °· You may wear a rib belt as directed by your caregiver to reduce pain. °· Practice deep breathing as directed by your caregiver to keep your lungs clear. °· Only take over-the-counter or prescription medicines for pain, fever, or discomfort as directed by your caregiver. °SEEK IMMEDIATE MEDICAL  CARE IF:  °· You have increasing pain or shortness of breath. °· You cough up blood. °· You have nausea, vomiting, or abdominal pain. °· You have a fever. °· You feel dizzy, weak, or you faint. °MAKE SURE YOU: °· Understand these instructions. °· Will watch your condition. °· Will get help right away if you are not doing well or get worse. °Document Released: 06/02/2004 Document Revised: 07/18/2011 Document Reviewed: 02/09/2011 °ExitCare® Patient Information ©2015 [employer]. This information is not intended to replace advice given to you by your health care provider. Make sure you discuss any questions you have with your health care provider. ° ° ° ° °Chest Contusion °A chest contusion is a deep bruise on your chest area. Contusions are the result of an injury that caused bleeding under the skin. A chest contusion may involve bruising of the skin, muscles, or ribs. The contusion may turn blue, purple, or yellow. Minor injuries will give you a painless contusion, but more severe contusions may stay painful and swollen for a few weeks. °CAUSES  °A contusion is usually caused by a blow, trauma, or direct force to an area of the body. °SYMPTOMS  °· Swelling and redness of the injured area. °· Discoloration of the injured area. °· Tenderness and soreness of the injured area. °· Pain. °DIAGNOSIS  °The diagnosis can be made by taking a history and performing a physical exam. An X-ray, CT scan, or MRI may be needed to determine if there were any associated injuries, such as broken bones (fractures) or internal   injuries. °TREATMENT  °Often, the best treatment for a chest contusion is resting, icing, and applying cold compresses to the injured area. Deep breathing exercises may be recommended to reduce the risk of pneumonia. Over-the-counter medicines may also be recommended for pain control. °HOME CARE INSTRUCTIONS  °· Put ice on the injured area. °¨ Put ice in a plastic bag. °¨ Place a towel between your skin and the  bag. °¨ Leave the ice on for 15-20 minutes, 03-04 times a day. °· Only take over-the-counter or prescription medicines as directed by your caregiver. Your caregiver may recommend avoiding anti-inflammatory medicines (aspirin, ibuprofen, and naproxen) for 48 hours because these medicines may increase bruising. °· Rest the injured area. °· Perform deep-breathing exercises as directed by your caregiver. °· Stop smoking if you smoke. °· Do not lift objects over 5 pounds (2.3 kg) for 3 days or longer if recommended by your caregiver. °SEEK IMMEDIATE MEDICAL CARE IF:  °· You have increased bruising or swelling. °· You have pain that is getting worse. °· You have difficulty breathing. °· You have dizziness, weakness, or fainting. °· You have blood in your urine or stool. °· You cough up or vomit blood. °· Your swelling or pain is not relieved with medicines. °MAKE SURE YOU:  °· Understand these instructions. °· Will watch your condition. °· Will get help right away if you are not doing well or get worse. °Document Released: 01/18/2001 Document Revised: 01/18/2012 Document Reviewed: 10/17/2011 °ExitCare® Patient Information ©2015 [employer]. This information is not intended to replace advice given to you by your health care provider. Make sure you discuss any questions you have with your health care provider. ° °

## 2015-08-18 ENCOUNTER — Emergency Department (INDEPENDENT_AMBULATORY_CARE_PROVIDER_SITE_OTHER)
Admission: EM | Admit: 2015-08-18 | Discharge: 2015-08-18 | Disposition: A | Discharge status: Home / Self Care | Payer: Medicare Other | Source: Home / Self Care | Attending: Family Medicine | Admitting: Family Medicine

## 2015-08-18 ENCOUNTER — Encounter: Payer: Self-pay | Admitting: *Deleted

## 2015-08-18 DIAGNOSIS — R8290 Unspecified abnormal findings in urine: Secondary | ICD-10-CM

## 2015-08-18 DIAGNOSIS — N390 Urinary tract infection, site not specified: Secondary | ICD-10-CM

## 2015-08-18 DIAGNOSIS — R358 Other polyuria: Secondary | ICD-10-CM | POA: Diagnosis not present

## 2015-08-18 DIAGNOSIS — R3589 Other polyuria: Secondary | ICD-10-CM

## 2015-08-18 LAB — POCT URINALYSIS DIP (MANUAL ENTRY)
Bilirubin, UA: NEGATIVE
Glucose, UA: NEGATIVE
Ketones, POC UA: NEGATIVE
NITRITE UA: POSITIVE — AB
PH UA: 5.5 (ref 5–8)
PROTEIN UA: NEGATIVE
Spec Grav, UA: 1.015 (ref 1.005–1.03)
Urobilinogen, UA: 0.2 (ref 0–1)

## 2015-08-18 MED ORDER — CIPROFLOXACIN HCL 500 MG PO TABS: 500.0000 mg | ORAL_TABLET | Freq: Two times a day (BID) | ORAL | Status: DC

## 2015-08-18 NOTE — Discharge Instructions (Signed)
Please take antibiotics as prescribed and be sure to complete entire course even if you start to feel better to ensure infection does not come back.  Be sure to stay well hydrated and follow up with your primary care provider as they recommend a low dose daily antibiotic or refer you to an urologist if urinary infections keep happening.

## 2015-08-18 NOTE — ED Notes (Signed)
Pt c/o 2 weeks of odorous urine and polyuria.

## 2015-08-18 NOTE — ED Provider Notes (Signed)
CSN: [id number]     Arrival date & time 08/18/15  1550 History   First MD Initiated Contact with Patient 08/18/15 1619     Chief Complaint  Patient presents with  . Polyuria  . Odorous Urine    (Consider location/radiation/quality/duration/timing/severity/associated sxs/prior Treatment) HPI  The pt is an 82[facility] concern for UTI with c/o malodorous urine and polyuria for 2 weeks.  She reports hx of recurrent UTIs with last UTI about 1-2 months ago.  She typically takes ciprofloxacin and does well on this medication.  She has mild nausea but denies abdominal pain, pain with urinary, back pain, fever, chills, n/v/d.   Past Medical History  Diagnosis Date  . Hypertension   . Insomnia   . Depression    Past Surgical History  Procedure Laterality Date  . Cholecystectomy    . Hernia repair    . Knee surgery     Family History  Problem Relation Age of Onset  . Stroke Mother   . Stroke Father    Social History  Substance Use Topics  . Smoking status: Former Games developermoker  . Smokeless tobacco: Never Used  . Alcohol Use: No   OB History    No data available     Review of Systems  Constitutional: Negative for fever and chills.  Cardiovascular: Negative for chest pain and palpitations.  Gastrointestinal: Negative for nausea, vomiting, abdominal pain and diarrhea.  Genitourinary: Positive for frequency. Negative for dysuria and urgency.       Malodorous urine  Musculoskeletal: Negative for myalgias and back pain.    Allergies  Latex; Prednisone; and Sulfa antibiotics  Home Medications   Prior to Admission medications   Medication Sig Start Date End Date Taking? Authorizing Provider  aspirin 81 MG tablet Take 81 mg by mouth daily.    Historical Provider, MD  ciprofloxacin (CIPRO) 500 MG tablet Take 1 tablet (500 mg total) by mouth 2 (two) times daily. One po bid x 7 days 08/18/15   Junius FinnerErin O'Malley, PA-C  fluticasone [facility](FLONASE) 50 MCG/ACT nasal spray 1 or 2  sprays each nostril twice a day 03/20/14   Lajean Manesavid Massey, MD  hydrochlorothiazide (HYDRODIURIL) 25 MG tablet Take 25 mg by mouth daily.      Historical Provider, MD  pravastatin (PRAVACHOL) 20 MG tablet Take 20 mg by mouth daily.      Historical Provider, MD  sertraline (ZOLOFT) 100 MG tablet Take 100 mg by mouth daily.      Historical Provider, MD  triamterene-hydrochlorothiazide (DYAZIDE) 37.5-25 MG per capsule Take 1 capsule by mouth every morning.    Historical Provider, MD  verapamil (VERELAN PM) 240 MG 24 hr capsule Take 240 mg by mouth at bedtime.      Historical Provider, MD   Meds Ordered and Administered this Visit  Medications - No data to display  BP 134/65 mmHg  Pulse 65  Temp(Src) 99.8 F (37.7 C) (Oral)  Wt 196 lb (88.905 kg)  SpO2 93% No data found.   Physical Exam  Constitutional: She appears well-developed and well-nourished. No distress.  HENT:  Head: Normocephalic and atraumatic.  Mouth/Throat: Oropharynx is clear and moist.  Eyes: Conjunctivae are normal. No scleral icterus.  Neck: Normal range of motion.  Cardiovascular: Normal rate, regular rhythm and normal heart sounds.   Pulmonary/Chest: Effort normal and breath sounds normal. No respiratory distress. She has no wheezes. She has no rales. She exhibits no tenderness.  Abdominal: Soft. She exhibits no distension  and no mass. There is no tenderness. There is no rebound, no guarding and no CVA tenderness.  Musculoskeletal: Normal range of motion.  Neurological: She is alert.  Skin: Skin is warm and dry. She is not diaphoretic.  Nursing note and vitals reviewed.   ED Course  Procedures (including critical care time)  Labs Review Labs Reviewed  POCT URINALYSIS DIP (MANUAL ENTRY) - Abnormal; Notable for the following:    Blood, UA trace-intact (*)    Nitrite, UA Positive (*)    Leukocytes, UA Trace (*)    All other components within normal limits  URINE CULTURE    Imaging Review No results  found.    MDM   1. Polyuria   2. Malodorous urine   3. Recurrent UTI    Pt c/o urinary symptoms for 2 weeks. Hx of recurrent UTIs.   UA c/w UTI, will send culture  Pt requesting Cirpo as she has had in the past and has no adverse effects.  Rx: Cipro.  Encouraged to stay well hydrated. F/u in 3-4 days if not improving, sooner if worsening, and for further evaluation of recurrent UTIs as she may benefit from a low-dose daily antibiotic to help prevent UTIs Patient verbalized understanding and agreement with treatment plan.     Junius Finner, PA-C 08/18/15 1754

## 2015-08-21 ENCOUNTER — Telehealth: Payer: Self-pay | Admitting: *Deleted

## 2015-08-21 LAB — URINE CULTURE

## 2016-06-18 ENCOUNTER — Encounter: Payer: Self-pay | Admitting: Emergency Medicine

## 2016-06-18 ENCOUNTER — Emergency Department (INDEPENDENT_AMBULATORY_CARE_PROVIDER_SITE_OTHER)
Admission: EM | Admit: 2016-06-18 | Discharge: 2016-06-18 | Disposition: A | Discharge status: Home / Self Care | Payer: Medicare Other | Source: Home / Self Care | Attending: Family Medicine | Admitting: Family Medicine

## 2016-06-18 ENCOUNTER — Emergency Department (INDEPENDENT_AMBULATORY_CARE_PROVIDER_SITE_OTHER): Payer: Medicare Other

## 2016-06-18 DIAGNOSIS — R05 Cough: Secondary | ICD-10-CM | POA: Diagnosis not present

## 2016-06-18 DIAGNOSIS — J111 Influenza due to unidentified influenza virus with other respiratory manifestations: Secondary | ICD-10-CM

## 2016-06-18 DIAGNOSIS — R69 Illness, unspecified: Secondary | ICD-10-CM

## 2016-06-18 MED ORDER — OSELTAMIVIR PHOSPHATE 75 MG PO CAPS: 75.0000 mg | ORAL_CAPSULE | Freq: Two times a day (BID) | ORAL | 0 refills | Status: DC

## 2016-06-18 MED ORDER — AZITHROMYCIN 250 MG PO TABS: 250.0000 mg | ORAL_TABLET | Freq: Every day | ORAL | 0 refills | Status: DC

## 2016-06-18 NOTE — Discharge Instructions (Signed)
°  Your symptoms are likely due to a virus such as the common cold or influenza however, if you developing worsening chest congestion with shortness of breath, persistent fever (>100.4*F) for 3 days, or symptoms not improving in 4-5 days, you may fill the antibiotic (azithromycin).  If you do fill the antibiotic,  please take antibiotics as prescribed and be sure to complete entire course even if you start to feel better to ensure infection does not come back.

## 2016-06-18 NOTE — ED Triage Notes (Signed)
Pt c/o cough and body aches that started yesterday. Denies fever. She has hx of pneumonia. She has not taken any meds for this.

## 2016-06-18 NOTE — ED Provider Notes (Signed)
CSN: [id number]     Arrival date & time 06/18/16  0910 History   First MD Initiated Contact with Patient 06/18/16 [phone]     No chief complaint on file.  (Consider location/radiation/quality/duration/timing/severity/associated sxs/prior Treatment) HPI  Gina Gibson is a 83 y.o. female presenting to UC with c/o sudden onset mild intermittent dry cough, body aches, fatigue, and subjective fever with hot and cold chills that started yesterday.  She did get the flu vaccine this year but is concerned as she has a hx of pneumonia.  She has also had the flu in the past and symptoms feel similar. She taken Tamiflu in the past and did well a few years ago.  No known sick contacts. No recent travel.  Denies n/v/d. Denies chest pain or SOB.   Past Medical History:  Diagnosis Date  . Depression   . Hypertension   . Insomnia    Past Surgical History:  Procedure Laterality Date  . CHOLECYSTECTOMY    . HERNIA REPAIR    . KNEE SURGERY     Family History  Problem Relation Age of Onset  . Stroke Mother   . Stroke Father    Social History  Substance Use Topics  . Smoking status: Former Games developer  . Smokeless tobacco: Never Used  . Alcohol use No   OB History    No data available     Review of Systems  Constitutional: Positive for chills, fatigue and fever (subjective).  HENT: Positive for congestion and rhinorrhea. Negative for ear pain, sore throat, trouble swallowing and voice change.   Respiratory: Positive for cough. Negative for shortness of breath.   Cardiovascular: Negative for chest pain and palpitations.  Gastrointestinal: Negative for abdominal pain, diarrhea, nausea and vomiting.  Musculoskeletal: Positive for arthralgias and myalgias. Negative for back pain.  Skin: Negative for rash.    Allergies  Latex; Prednisone; and Sulfa antibiotics  Home Medications   Prior to Admission medications   Medication Sig Start Date End Date Taking? Authorizing Provider  aspirin 81 MG tablet  Take 81 mg by mouth daily.    Historical Provider, MD  azithromycin (ZITHROMAX) 250 MG tablet Take 1 tablet (250 mg total) by mouth daily. Take first 2 tablets together, then 1 every day until finished. 06/18/16   Junius Finner, PA-C  ciprofloxacin (CIPRO) 500 MG tablet Take 1 tablet (500 mg total) by mouth 2 (two) times daily. One po bid x 7 days 08/18/15   Junius Finner, PA-C  fluticasone [facility]) 50 MCG/ACT nasal spray 1 or 2 sprays each nostril twice a day 03/20/14   Lajean Manes, MD  hydrochlorothiazide (HYDRODIURIL) 25 MG tablet Take 25 mg by mouth daily.      Historical Provider, MD  oseltamivir (TAMIFLU) 75 MG capsule Take 1 capsule (75 mg total) by mouth every 12 (twelve) hours. 06/18/16   Junius Finner, PA-C  pravastatin (PRAVACHOL) 20 MG tablet Take 20 mg by mouth daily.      Historical Provider, MD  sertraline (ZOLOFT) 100 MG tablet Take 100 mg by mouth daily.      Historical Provider, MD  triamterene-hydrochlorothiazide (DYAZIDE) 37.5-25 MG per capsule Take 1 capsule by mouth every morning.    Historical Provider, MD  verapamil (VERELAN PM) 240 MG 24 hr capsule Take 240 mg by mouth at bedtime.      Historical Provider, MD   Meds Ordered and Administered this Visit  Medications - No data to display  BP 121/81 (BP Location: Right Arm)   Pulse  68   Temp 98.6 F (37 C) (Oral)   Wt 195 lb (88.5 kg)   SpO2 94%   BMI 32.20 kg/m  No data found.   Physical Exam  Constitutional: She is oriented to person, place, and time. She appears well-developed and well-nourished. No distress.  HENT:  Head: Normocephalic and atraumatic.  Right Ear: Tympanic membrane normal.  Left Ear: Tympanic membrane normal.  Nose: Nose normal.  Mouth/Throat: Uvula is midline, oropharynx is clear and moist and mucous membranes are normal.  Eyes: EOM are normal.  Neck: Normal range of motion. Neck supple.  Cardiovascular: Normal rate and regular rhythm.   Pulmonary/Chest: Effort normal and breath sounds  normal. No stridor. No respiratory distress. She has no wheezes. She has no rales.  Musculoskeletal: Normal range of motion.  Lymphadenopathy:    She has no cervical adenopathy.  Neurological: She is alert and oriented to person, place, and time.  Skin: Skin is warm and dry. She is not diaphoretic.  Psychiatric: She has a normal mood and affect. Her behavior is normal.  Nursing note and vitals reviewed.   Urgent Care Course     Procedures (including critical care time)  Labs Review Labs Reviewed - No data to display  Imaging Review Dg Chest 2 View  Result Date: 06/18/2016 CLINICAL DATA:  Cough.  Subjective fever. EXAM: CHEST  2 VIEW COMPARISON:  04/24/2014 chest radiograph. FINDINGS: Stable cardiomediastinal silhouette with normal heart size. No pneumothorax. No pleural effusion. Minimal scarring versus atelectasis at the lung bases. No pulmonary edema. No acute consolidative airspace disease. IMPRESSION: Minimal bibasilar scarring versus atelectasis. Otherwise no active disease in the chest. Electronically Signed   By: Delbert PhenixJason A Poff M.D.   On: 06/18/2016 09:52     MDM   1. Influenza-like illness    Pt concerned for pneumonia and influenza. Given pt hx, reported symptoms and age, will start pt on Tamiflu for likely influenza.  CXR: no evidence of pneumonia at this time. Prescription to hold with expiration date for azithromycin. Pt to fill if persistent fever develops or not improving in 1 week.  F/u with PCP next week as needed. Discussed symptoms that warrant emergent care in the ED.     Junius Finnerrin O'Malley, PA-C 06/18/16 1010    Junius FinnerErin O'Malley, PA-C 06/18/16 1011

## 2017-10-07 ENCOUNTER — Emergency Department (INDEPENDENT_AMBULATORY_CARE_PROVIDER_SITE_OTHER)
Admission: EM | Admit: 2017-10-07 | Discharge: 2017-10-07 | Disposition: A | Discharge status: Home / Self Care | Payer: Medicare Other | Source: Home / Self Care | Attending: Family Medicine | Admitting: Family Medicine

## 2017-10-07 ENCOUNTER — Encounter: Payer: Self-pay | Admitting: Emergency Medicine

## 2017-10-07 DIAGNOSIS — M545 Low back pain, unspecified: Secondary | ICD-10-CM

## 2017-10-07 DIAGNOSIS — R300 Dysuria: Secondary | ICD-10-CM

## 2017-10-07 DIAGNOSIS — R3989 Other symptoms and signs involving the genitourinary system: Secondary | ICD-10-CM

## 2017-10-07 DIAGNOSIS — R110 Nausea: Secondary | ICD-10-CM

## 2017-10-07 LAB — POCT URINALYSIS DIP (MANUAL ENTRY)
Bilirubin, UA: NEGATIVE
Glucose, UA: NEGATIVE mg/dL
Ketones, POC UA: NEGATIVE mg/dL
Nitrite, UA: NEGATIVE
Protein Ur, POC: NEGATIVE mg/dL
Spec Grav, UA: 1.015 (ref 1.010–1.025)
Urobilinogen, UA: 0.2 E.U./dL
pH, UA: 5.5 (ref 5.0–8.0)

## 2017-10-07 MED ORDER — NITROFURANTOIN MONOHYD MACRO 100 MG PO CAPS: 100.0000 mg | ORAL_CAPSULE | Freq: Two times a day (BID) | ORAL | 0 refills | Status: DC

## 2017-10-07 NOTE — ED Triage Notes (Signed)
Pt c/o nausea and fatigue and low back pain x1 week.

## 2017-10-07 NOTE — ED Provider Notes (Signed)
[name]    CSN: [id number] Arrival date & time: 10/07/17  1191     History   Chief Complaint Chief Complaint  Patient presents with  . Dysuria    HPI Gina Gibson is a 84 y.o. female.   HPI  Gina Gibson is a 84 y.o. female presenting to UC with c/o nausea, fatigue, bladder pressure and lower back pain for about 1 week.  She has started to notice urinary urgency and frequency. No relief with OTC cranberry pills. She is currently on a daily low-dose prescription for Keflex due to recurrent UTIs.  Last UTI was several months ago.  Denies fever, chills, vomiting or diarrhea. Denies cough or congestion.   Past Medical History:  Diagnosis Date  . Depression   . Hypertension   . Insomnia     Patient Active Problem List   Diagnosis Date Noted  . PNEUMONIA, COMMUNITY ACQUIRED, PNEUMOCOCCAL 06/29/2010  . ACUTE BRONCHITIS 03/15/2010  . CONTUSION, RIGHT CHEST WALL 03/15/2010  . HYPERTENSION 06/10/2009  . PNEUMONIA, HX OF 06/10/2009    Past Surgical History:  Procedure Laterality Date  . CHOLECYSTECTOMY    . HERNIA REPAIR    . KNEE SURGERY      OB History   None      Home Medications    Prior to Admission medications   Medication Sig Start Date End Date Taking? Authorizing Provider  aspirin 81 MG tablet Take 81 mg by mouth daily.    [provider]  fluticasone [facility]) 50 MCG/ACT nasal spray 1 or 2 sprays each nostril twice a day 03/20/14   Lajean Manes, MD  hydrochlorothiazide (HYDRODIURIL) 25 MG tablet Take 25 mg by mouth daily.      [provider]  nitrofurantoin, macrocrystal-monohydrate, (MACROBID) 100 MG capsule Take 1 capsule (100 mg total) by mouth 2 (two) times daily. 10/07/17   Lurene Shadow, PA-C  oseltamivir (TAMIFLU) 75 MG capsule Take 1 capsule (75 mg total) by mouth every 12 (twelve) hours. 06/18/16   Lurene Shadow, PA-C  pravastatin (PRAVACHOL) 20 MG tablet Take 20 mg by mouth daily.      [provider]    sertraline (ZOLOFT) 100 MG tablet Take 100 mg by mouth daily.      [provider]  triamterene-hydrochlorothiazide (DYAZIDE) 37.5-25 MG per capsule Take 1 capsule by mouth every morning.    [provider]  verapamil (VERELAN PM) 240 MG 24 hr capsule Take 240 mg by mouth at bedtime.      [provider]    Family History Family History  Problem Relation Age of Onset  . Stroke Mother   . Stroke Father     Social History Social History   Tobacco Use  . Smoking status: Former Games developer  . Smokeless tobacco: Never Used  Substance Use Topics  . Alcohol use: No  . Drug use: No     Allergies   Latex; Prednisone; Sulfa antibiotics; and Ciprofloxacin   Review of Systems Review of Systems  Constitutional: Positive for fatigue. Negative for appetite change, chills and fever.  Respiratory: Negative for cough and shortness of breath.   Cardiovascular: Negative for chest pain and palpitations.  Gastrointestinal: Positive for abdominal pain and nausea. Negative for diarrhea and vomiting.  Genitourinary: Positive for frequency, pelvic pain (bladder pressure) and urgency. Negative for dysuria.  Musculoskeletal: Positive for back pain. Negative for gait problem.  Skin: Negative for rash.  Neurological: Negative for dizziness, light-headedness and headaches.  Physical Exam Triage Vital Signs ED Triage Vitals  Enc Vitals Group     BP 10/07/17 0948 (!) 155/80     Pulse Rate 10/07/17 0948 (!) 59     Resp --      Temp 10/07/17 0948 97.9 F (36.6 C)     Temp Source 10/07/17 0948 Oral     SpO2 10/07/17 0948 95 %     Weight 10/07/17 0949 185 lb (83.9 kg)     Height --      Head Circumference --      Peak Flow --      Pain Score 10/07/17 0949 0     Pain Loc --      Pain Edu? --      Excl. in GC? --    No data found.  Updated Vital Signs BP (!) 155/80 (BP Location: Right Arm)   Pulse (!) 59   Temp 97.9 F (36.6 C) (Oral)   Wt 185 lb (83.9 kg)    SpO2 95%   BMI 30.55 kg/m   Visual Acuity Right Eye Distance:   Left Eye Distance:   Bilateral Distance:    Right Eye Near:   Left Eye Near:    Bilateral Near:     Physical Exam  Constitutional: She is oriented to person, place, and time. She appears well-developed and well-nourished. No distress.  HENT:  Head: Normocephalic and atraumatic.  Mouth/Throat: Oropharynx is clear and moist.  Eyes: EOM are normal.  Neck: Normal range of motion. Neck supple.  Cardiovascular: Normal rate and regular rhythm.  Pulmonary/Chest: Effort normal and breath sounds normal. No stridor. No respiratory distress. She has no wheezes. She has no rales.  Abdominal: Soft. She exhibits no distension. There is tenderness ( suprapubic). There is no CVA tenderness.  Musculoskeletal: Normal range of motion.  Neurological: She is alert and oriented to person, place, and time.  Skin: Skin is warm and dry. She is not diaphoretic.  Psychiatric: She has a normal mood and affect. Her behavior is normal.  Nursing note and vitals reviewed.    UC Treatments / Results  Labs (all labs ordered are listed, but only abnormal results are displayed) Labs Reviewed  POCT URINALYSIS DIP (MANUAL ENTRY) - Abnormal; Notable for the following components:      Result Value   Blood, UA trace-intact (*)    Leukocytes, UA Moderate (2+) (*)    All other components within normal limits  URINE CULTURE    EKG None  Radiology No results found.  Procedures Procedures (including critical care time)  Medications Ordered in UC Medications - No data to display  Initial Impression / Assessment and Plan / UC Course  I have reviewed the triage vital signs and the nursing notes.  Pertinent labs & imaging results that were available during my care of the patient were reviewed by me and considered in my medical decision making (see chart for details).     Hx and UA appears c/w UTI Culture sent Pt is allergic to Cipr and  Bactrim and is currently taking low-dose keflex.   Will start pt on Macrobid while culture pending Home care instructions provided below.    Final Clinical Impressions(s) / UC Diagnoses   Final diagnoses:  Dysuria  Sensation of pressure in bladder area  Acute bilateral low back pain without sciatica  Nausea without vomiting     Discharge Instructions      Please be sure to stay well hydrated and get plenty  of rest.  Please follow up with urology or family medicine if not improving in 3-4 days. Please go to the hospital or call 911 if symptoms worsening- dizziness, passing out, unable to keep down fluids, or other new concerning symptoms develop.     ED Prescriptions    Medication Sig Dispense Auth. Provider   nitrofurantoin, macrocrystal-monohydrate, (MACROBID) 100 MG capsule Take 1 capsule (100 mg total) by mouth 2 (two) times daily. 10 capsule Lurene ShadowPhelps, Iram Astorino O, PA-C     Controlled Substance Prescriptions [place] Controlled Substance Registry consulted? Not Applicable   Rolla Platehelps, Chimere Klingensmith O, PA-C 10/07/17 1108

## 2017-10-07 NOTE — Discharge Instructions (Signed)
°  Please be sure to stay well hydrated and get plenty of rest.  Please follow up with urology or family medicine if not improving in 3-4 days. Please go to the hospital or call 911 if symptoms worsening- dizziness, passing out, unable to keep down fluids, or other new concerning symptoms develop.

## 2017-10-09 ENCOUNTER — Telehealth: Payer: Self-pay

## 2017-10-09 LAB — URINE CULTURE
MICRO NUMBER:: 90662632
SPECIMEN QUALITY:: ADEQUATE

## 2017-10-09 NOTE — Telephone Encounter (Signed)
Notified patient of UCX results and to follow up with PCP since there is not a UTI.  Pt going to make appointment.

## 2018-05-31 ENCOUNTER — Encounter: Payer: Self-pay | Admitting: *Deleted

## 2018-05-31 ENCOUNTER — Emergency Department (INDEPENDENT_AMBULATORY_CARE_PROVIDER_SITE_OTHER)
Admission: EM | Admit: 2018-05-31 | Discharge: 2018-05-31 | Disposition: A | Discharge status: Home / Self Care | Payer: Medicare Other | Source: Home / Self Care

## 2018-05-31 DIAGNOSIS — J209 Acute bronchitis, unspecified: Secondary | ICD-10-CM | POA: Diagnosis not present

## 2018-05-31 MED ORDER — HYDROCODONE-HOMATROPINE 5-1.5 MG/5ML PO SYRP: 5.0000 mL | ORAL_SOLUTION | Freq: Four times a day (QID) | ORAL | 0 refills | Status: DC | PRN

## 2018-05-31 MED ORDER — AZITHROMYCIN 250 MG PO TABS: ORAL_TABLET | ORAL | 0 refills | Status: DC

## 2018-05-31 NOTE — ED Triage Notes (Signed)
Non-productive cough and nasal congestion x2 days. Hx of pneumonia several times over the past years. Pt is concerned this may progress to that again. Denies fever.

## 2018-05-31 NOTE — ED Provider Notes (Addendum)
[name]    CSN: [id number] Arrival date & time: 05/31/18  1152     History   Chief Complaint Chief Complaint  Patient presents with  . Cough  . Nasal Congestion    HPI Gina Gibson is a 85 y.o. female.   This is an established [facility] patient, 85 year old woman, who comes in today for evaluation of Non-productive cough and nasal congestion x2 days. Hx of pneumonia several times over the past years. Pt is concerned this may progress to that again. Denies fever.  Patient is a former smoker.  He denies a history of asthma.  She is allergic to prednisone.  Patient did receive a flu shot this year.  She lives alone but she has a son who looks in on her.     Past Medical History:  Diagnosis Date  . Depression   . Hypertension   . Insomnia     Patient Active Problem List   Diagnosis Date Noted  . PNEUMONIA, COMMUNITY ACQUIRED, PNEUMOCOCCAL 06/29/2010  . ACUTE BRONCHITIS 03/15/2010  . CONTUSION, RIGHT CHEST WALL 03/15/2010  . HYPERTENSION 06/10/2009  . PNEUMONIA, HX OF 06/10/2009    Past Surgical History:  Procedure Laterality Date  . CHOLECYSTECTOMY    . HERNIA REPAIR    . KNEE SURGERY      OB History   No obstetric history on file.      Home Medications    Prior to Admission medications   Medication Sig Start Date End Date Taking? Authorizing Provider  aspirin 81 MG tablet Take 81 mg by mouth daily.   Yes [provider]  nitrofurantoin, macrocrystal-monohydrate, (MACROBID) 100 MG capsule Take 1 capsule (100 mg total) by mouth 2 (two) times daily. 10/07/17  Yes Phelps, Erin O, PA-C  pravastatin (PRAVACHOL) 20 MG tablet Take 20 mg by mouth daily.     Yes [provider]  sertraline (ZOLOFT) 100 MG tablet Take 100 mg by mouth daily.     Yes [provider]  triamterene-hydrochlorothiazide (DYAZIDE) 37.5-25 MG per capsule Take 1 capsule by mouth every morning.   Yes [provider]  verapamil  (VERELAN PM) 240 MG 24 hr capsule Take 240 mg by mouth at bedtime.     Yes [provider]  azithromycin (ZITHROMAX) 250 MG tablet Take 2 tabs PO x 1 dose, then 1 tab PO QD x 4 days 05/31/18   Elvina Sidle, MD  fluticasone Aleda Grana) 50 MCG/ACT nasal spray 1 or 2 sprays each nostril twice a day 03/20/14   Lajean Manes, MD  HYDROcodone-homatropine (HYDROMET) 5-1.5 MG/5ML syrup Take 5 mLs by mouth every 6 (six) hours as needed for cough. 05/31/18   Elvina Sidle, MD    Family History Family History  Problem Relation Age of Onset  . Stroke Mother   . Coronary artery disease Mother   . Stroke Father   . Coronary artery disease Father     Social History Social History   Tobacco Use  . Smoking status: Former Games developer  . Smokeless tobacco: Never Used  Substance Use Topics  . Alcohol use: No  . Drug use: No     Allergies   Latex; Prednisone; Sulfa antibiotics; and Ciprofloxacin   Review of Systems Review of Systems   Physical Exam Triage Vital Signs ED Triage Vitals  Enc Vitals Group     BP 05/31/18 1228 (!) 144/77     Pulse Rate 05/31/18 1228 69     Resp 05/31/18 1228 16  Temp 05/31/18 1228 98.1 F (36.7 C)     Temp Source 05/31/18 1228 Oral     SpO2 05/31/18 1228 95 %     Weight 05/31/18 1229 185 lb (83.9 kg)     Height 05/31/18 1229 5\' 7"  (1.702 m)     Head Circumference --      Peak Flow --      Pain Score 05/31/18 1229 0     Pain Loc --      Pain Edu? --      Excl. in GC? --    No data found.  Updated Vital Signs BP (!) 144/77 (BP Location: Right Arm)   Pulse 69   Temp 98.1 F (36.7 C) (Oral)   Resp 16   Ht 5\' 7"  (1.702 m)   Wt 83.9 kg   SpO2 95%   BMI 28.98 kg/m    Physical Exam Vitals signs and nursing note reviewed.  Constitutional:      Appearance: Normal appearance.  HENT:     Head: Normocephalic.     Right Ear: Tympanic membrane normal.     Left Ear: Tympanic membrane normal.     Nose: Rhinorrhea present.      Mouth/Throat:     Mouth: Mucous membranes are moist.     Pharynx: Oropharynx is clear.  Eyes:     Conjunctiva/sclera: Conjunctivae normal.  Neck:     Musculoskeletal: Normal range of motion and neck supple.  Cardiovascular:     Rate and Rhythm: Normal rate and regular rhythm.     Heart sounds: Normal heart sounds.  Pulmonary:     Effort: Pulmonary effort is normal.     Breath sounds: Wheezing, rhonchi and rales present.     Comments: Few rales at the right base, diffuse wheezes and rhonchi bilaterally Musculoskeletal: Normal range of motion.     Comments: Patient uses a cane and walks with a stooped posture  Skin:    General: Skin is warm and dry.  Neurological:     General: No focal deficit present.     Mental Status: She is alert.  Psychiatric:        Mood and Affect: Mood normal.        Behavior: Behavior normal.        Thought Content: Thought content normal.        Judgment: Judgment normal.      UC Treatments / Results  Labs (all labs ordered are listed, but only abnormal results are displayed) Labs Reviewed - No data to display  EKG None  Radiology No results found.  Procedures Procedures (including critical care time)  Medications Ordered in UC Medications - No data to display  Initial Impression / Assessment and Plan / UC Course  I have reviewed the triage vital signs and the nursing notes.  Pertinent labs & imaging results that were available during my care of the patient were reviewed by me and considered in my medical decision making (see chart for details).    Final Clinical Impressions(s) / UC Diagnoses   Final diagnoses:  Acute bronchitis, unspecified organism     Discharge Instructions     Patient has been experiencing improvement in the next 1 to 2 days.  If the cough is getting worse, if you develop a fever, or you are failing to improve after 2 days, please return here for an x-ray.    ED Prescriptions    Medication Sig Dispense  Auth. Provider   azithromycin [facility])  250 MG tablet Take 2 tabs PO x 1 dose, then 1 tab PO QD x 4 days 6 tablet Elvina SidleLauenstein, Macayla Ekdahl, MD   HYDROcodone-homatropine (HYDROMET) 5-1.5 MG/5ML syrup Take 5 mLs by mouth every 6 (six) hours as needed for cough. 60 mL Elvina SidleLauenstein, Vikki Gains, MD     Controlled Substance Prescriptions [place] Controlled Substance Registry consulted? Not Applicable   Elvina SidleLauenstein, Sahej Schrieber, MD 05/31/18 1244    Elvina SidleLauenstein, Mackinley Kiehn, MD 05/31/18 1247

## 2018-05-31 NOTE — Discharge Instructions (Addendum)
Patient has been experiencing improvement in the next 1 to 2 days.  If the cough is getting worse, if you develop a fever, or you are failing to improve after 2 days, please return here for an x-ray.

## 2020-10-30 ENCOUNTER — Other Ambulatory Visit: Payer: Self-pay

## 2020-10-30 ENCOUNTER — Emergency Department (INDEPENDENT_AMBULATORY_CARE_PROVIDER_SITE_OTHER)
Admission: EM | Admit: 2020-10-30 | Discharge: 2020-10-30 | Disposition: A | Discharge status: Home / Self Care | Payer: Medicare Other | Source: Home / Self Care

## 2020-10-30 DIAGNOSIS — R6883 Chills (without fever): Secondary | ICD-10-CM | POA: Diagnosis not present

## 2020-10-30 DIAGNOSIS — R059 Cough, unspecified: Secondary | ICD-10-CM | POA: Diagnosis not present

## 2020-10-30 MED ORDER — BENZONATATE 200 MG PO CAPS: 200.0000 mg | ORAL_CAPSULE | Freq: Three times a day (TID) | ORAL | 0 refills | Status: AC | PRN

## 2020-10-30 MED ORDER — AZITHROMYCIN 250 MG PO TABS: 250.0000 mg | ORAL_TABLET | Freq: Every day | ORAL | 0 refills | Status: DC

## 2020-10-30 NOTE — ED Provider Notes (Signed)
[name]    CSN: [id number] Arrival date & time: 10/30/20  0934      History   Chief Complaint Chief Complaint  Patient presents with   Cough   Chills    HPI Gina Gibson is a 87 y.o. female.   HPI Very pleasant 87 year old female presents with cough x3 days, reports coughing up green mucus.  Denies recent COVID exposure, vaccinated for COVID-19, PMH significant for CAP.  Past Medical History:  Diagnosis Date   Depression    Hypertension    Insomnia     Patient Active Problem List   Diagnosis Date Noted   PNEUMONIA, COMMUNITY ACQUIRED, PNEUMOCOCCAL 06/29/2010   ACUTE BRONCHITIS 03/15/2010   CONTUSION, RIGHT CHEST WALL 03/15/2010   HYPERTENSION 06/10/2009   PNEUMONIA, HX OF 06/10/2009    Past Surgical History:  Procedure Laterality Date   CHOLECYSTECTOMY     HERNIA REPAIR     KNEE SURGERY      OB History   No obstetric history on file.      Home Medications    Prior to Admission medications   Medication Sig Start Date End Date Taking? Authorizing Provider  azithromycin (ZITHROMAX) 250 MG tablet Take 1 tablet (250 mg total) by mouth daily. Take first 2 tablets together, then 1 every day until finished. 10/30/20  Yes Trevor Iha, FNP  benzonatate (TESSALON) 200 MG capsule Take 1 capsule (200 mg total) by mouth 3 (three) times daily as needed for up to 7 days for cough. 10/30/20 11/06/20 Yes Trevor Iha, FNP  aspirin 81 MG tablet Take 81 mg by mouth daily.    [provider]  fluticasone Aleda Grana) 50 MCG/ACT nasal spray 1 or 2 sprays each nostril twice a day 03/20/14   Lajean Manes, MD  HYDROcodone-homatropine (HYDROMET) 5-1.5 MG/5ML syrup Take 5 mLs by mouth every 6 (six) hours as needed for cough. 05/31/18   Elvina Sidle, MD  pravastatin (PRAVACHOL) 20 MG tablet Take 20 mg by mouth daily.      [provider]  sertraline (ZOLOFT) 100 MG tablet Take 100 mg by mouth daily.      [provider]   triamterene-hydrochlorothiazide (DYAZIDE) 37.5-25 MG per capsule Take 1 capsule by mouth every morning.    [provider]  verapamil (VERELAN PM) 240 MG 24 hr capsule Take 240 mg by mouth at bedtime.      [provider]    Family History Family History  Problem Relation Age of Onset   Stroke Mother    Coronary artery disease Mother    Stroke Father    Coronary artery disease Father     Social History Social History   Tobacco Use   Smoking status: Former    Pack years: 0.00   Smokeless tobacco: Never  Vaping Use   Vaping Use: Never used  Substance Use Topics   Alcohol use: No   Drug use: No     Allergies   Latex, Prednisone, Sulfa antibiotics, and Ciprofloxacin   Review of Systems Review of Systems  Constitutional:  Positive for chills.  HENT: Negative.    Eyes: Negative.   Respiratory:  Positive for cough.   Cardiovascular: Negative.   Gastrointestinal: Negative.   Genitourinary: Negative.   Musculoskeletal: Negative.   Skin: Negative.   Neurological: Negative.     Physical Exam Triage Vital Signs ED Triage Vitals  Enc Vitals Group     BP 10/30/20 0950 105/65     Pulse Rate 10/30/20 0950  71     Resp 10/30/20 0950 17     Temp 10/30/20 0950 99 F (37.2 C)     Temp Source 10/30/20 0950 Oral     SpO2 10/30/20 0950 96 %     Weight --      Height --      Head Circumference --      Peak Flow --      Pain Score 10/30/20 0948 0     Pain Loc --      Pain Edu? --      Excl. in GC? --    No data found.  Updated Vital Signs BP 105/65 (BP Location: Right Arm)   Pulse 71   Temp 99 F (37.2 C) (Oral)   Resp 17   SpO2 96%     Physical Exam Vitals and nursing note reviewed.  Constitutional:      General: She is not in acute distress.    Appearance: Normal appearance. She is normal weight. She is ill-appearing.  HENT:     Head: Normocephalic and atraumatic.     Right Ear: Tympanic membrane and ear canal normal.     Left Ear:  Tympanic membrane and ear canal normal.     Nose: Nose normal.     Mouth/Throat:     Mouth: Mucous membranes are moist.     Pharynx: Oropharynx is clear.  Cardiovascular:     Rate and Rhythm: Normal rate and regular rhythm.     Pulses: Normal pulses.     Heart sounds: Normal heart sounds.  Pulmonary:     Effort: Pulmonary effort is normal. No respiratory distress.     Breath sounds: Normal breath sounds. No stridor. No wheezing, rhonchi or rales.     Comments: Decreased air intake bibasilarly Musculoskeletal:        General: Normal range of motion.     Cervical back: Normal range of motion and neck supple. No tenderness.  Lymphadenopathy:     Cervical: No cervical adenopathy.  Skin:    General: Skin is warm and dry.  Neurological:     General: No focal deficit present.     Mental Status: She is alert and oriented to person, place, and time.  Psychiatric:        Mood and Affect: Mood normal.        Behavior: Behavior normal.     UC Treatments / Results  Labs (all labs ordered are listed, but only abnormal results are displayed) Labs Reviewed - No data to display  EKG   Radiology No results found.  Procedures Procedures (including critical care time)  Medications Ordered in UC Medications - No data to display  Initial Impression / Assessment and Plan / UC Course  I have reviewed the triage vital signs and the nursing notes.  Pertinent labs & imaging results that were available during my care of the patient were reviewed by me and considered in my medical decision making (see chart for details).     MDM: 1. Cough, 2. Chills.  Patient discharged home, hemodynamically stable. Final Clinical Impressions(s) / UC Diagnoses   Final diagnoses:  Cough  Chills     Discharge Instructions      Advised/instructed patient to take medication as directed with food to completion.  Advised patient may use Tessalon Perles daily, as needed for cough.     ED  Prescriptions     Medication Sig Dispense Auth. Provider   azithromycin (ZITHROMAX) 250 MG tablet  Take 1 tablet (250 mg total) by mouth daily. Take first 2 tablets together, then 1 every day until finished. 6 tablet Trevor Iha, FNP   benzonatate (TESSALON) 200 MG capsule Take 1 capsule (200 mg total) by mouth 3 (three) times daily as needed for up to 7 days for cough. 30 capsule Trevor Iha, FNP      PDMP not reviewed this encounter.   Trevor Iha, FNP 10/30/20 1027

## 2020-10-30 NOTE — ED Triage Notes (Signed)
Pt c/o cough x 3 days. Coughing up green mucous. Chills last night. Coricidin prn. No known covid exposure. Vaccinated. Hx of pneumonia.

## 2020-10-30 NOTE — Discharge Instructions (Addendum)
Advised/instructed patient to take medication as directed with food to completion.  Advised patient may use Tessalon Perles daily, as needed for cough.

## 2020-11-04 ENCOUNTER — Emergency Department (INDEPENDENT_AMBULATORY_CARE_PROVIDER_SITE_OTHER)
Admission: EM | Admit: 2020-11-04 | Discharge: 2020-11-04 | Disposition: A | Discharge status: Home / Self Care | Payer: Medicare Other | Source: Home / Self Care

## 2020-11-04 ENCOUNTER — Emergency Department (INDEPENDENT_AMBULATORY_CARE_PROVIDER_SITE_OTHER): Payer: Medicare Other

## 2020-11-04 ENCOUNTER — Other Ambulatory Visit: Payer: Self-pay

## 2020-11-04 DIAGNOSIS — I350 Nonrheumatic aortic (valve) stenosis: Secondary | ICD-10-CM | POA: Diagnosis not present

## 2020-11-04 DIAGNOSIS — R0789 Other chest pain: Secondary | ICD-10-CM

## 2020-11-04 DIAGNOSIS — R059 Cough, unspecified: Secondary | ICD-10-CM | POA: Diagnosis not present

## 2020-11-04 DIAGNOSIS — J309 Allergic rhinitis, unspecified: Secondary | ICD-10-CM | POA: Diagnosis not present

## 2020-11-04 MED ORDER — FEXOFENADINE HCL 180 MG PO TABS: 180.0000 mg | ORAL_TABLET | Freq: Every day | ORAL | 0 refills | Status: AC

## 2020-11-04 NOTE — ED Provider Notes (Signed)
[facility]    CSN: [id number] Arrival date & time: 11/04/20  1011      History   Chief Complaint Chief Complaint  Patient presents with   Cough   Nausea   Chest discomfort     HPI Gina Gibson is a 87 y.o. female.   HPI 87 year old female presents with cough not improved from previous visit of Friday, 10/30/2020.  Patient was evaluated by me during that office visit and reports nausea when eating an episode of feeling something in her chest, but not pain. PMH significant for moderate aortic stenosis and palpitations is currently followed by [facility] with last office visit on 10/06/2020 in which she was recommended to have aortic valve replaced.  Additionally, at that office visit patient Zio patch monitor did not show significant tachyarrhythmia or evidence of heart block.  Past Medical History:  Diagnosis Date   Depression    Hypertension    Insomnia     Patient Active Problem List   Diagnosis Date Noted   PNEUMONIA, COMMUNITY ACQUIRED, PNEUMOCOCCAL 06/29/2010   ACUTE BRONCHITIS 03/15/2010   CONTUSION, RIGHT CHEST WALL 03/15/2010   HYPERTENSION 06/10/2009   PNEUMONIA, HX OF 06/10/2009    Past Surgical History:  Procedure Laterality Date   CHOLECYSTECTOMY     HERNIA REPAIR     KNEE SURGERY      OB History   No obstetric history on file.      Home Medications    Prior to Admission medications   Medication Sig Start Date End Date Taking? Authorizing Provider  fexofenadine (ALLEGRA ALLERGY) 180 MG tablet Take 1 tablet (180 mg total) by mouth daily for 15 days. 11/04/20 11/19/20 Yes Trevor Iha, FNP  HYDROcodone-homatropine (HYDROMET) 5-1.5 MG/5ML syrup Take 5 mLs by mouth every 6 (six) hours as needed for cough. 05/31/18  Yes Elvina Sidle, MD  aspirin 81 MG tablet Take 81 mg by mouth daily.    [provider]  azithromycin (ZITHROMAX) 250 MG tablet Take 1 tablet (250 mg total) by mouth daily. Take first 2 tablets together, then 1  every day until finished. 10/30/20   Trevor Iha, FNP  benzonatate (TESSALON) 200 MG capsule Take 1 capsule (200 mg total) by mouth 3 (three) times daily as needed for up to 7 days for cough. 10/30/20 11/06/20  Trevor Iha, FNP  fluticasone Aleda Grana) 50 MCG/ACT nasal spray 1 or 2 sprays each nostril twice a day 03/20/14   Lajean Manes, MD  pravastatin (PRAVACHOL) 20 MG tablet Take 20 mg by mouth daily.      [provider]  sertraline (ZOLOFT) 100 MG tablet Take 100 mg by mouth daily.      [provider]  triamterene-hydrochlorothiazide (DYAZIDE) 37.5-25 MG per capsule Take 1 capsule by mouth every morning.    [provider]  verapamil (VERELAN PM) 240 MG 24 hr capsule Take 240 mg by mouth at bedtime.      [provider]    Family History Family History  Problem Relation Age of Onset   Stroke Mother    Coronary artery disease Mother    Stroke Father    Coronary artery disease Father     Social History Social History   Tobacco Use   Smoking status: Former    Pack years: 0.00   Smokeless tobacco: Never  Vaping Use   Vaping Use: Never used  Substance Use Topics   Alcohol use: No   Drug use: No     Allergies  Latex, Prednisone, Sulfa antibiotics, and Ciprofloxacin   Review of Systems Review of Systems  HENT:  Positive for congestion and postnasal drip.   Respiratory:  Positive for cough.   Cardiovascular:        Chest discomfort for 2 to 3 days  All other systems reviewed and are negative.   Physical Exam Triage Vital Signs ED Triage Vitals  Enc Vitals Group     BP 11/04/20 1036 132/62     Pulse Rate 11/04/20 1036 69     Resp 11/04/20 1036 20     Temp 11/04/20 1036 98.3 F (36.8 C)     Temp Source 11/04/20 1036 Oral     SpO2 11/04/20 1036 98 %     Weight 11/04/20 1033 170 lb (77.1 kg)     Height 11/04/20 1033 5\' 6"  (1.676 m)     Head Circumference --      Peak Flow --      Pain Score 11/04/20 1033 0     Pain Loc --       Pain Edu? --      Excl. in GC? --    No data found.  Updated Vital Signs BP 132/62   Pulse 69   Temp 98.3 F (36.8 C) (Oral)   Resp 20   Ht 5\' 6"  (1.676 m)   Wt 170 lb (77.1 kg)   SpO2 98%   BMI 27.44 kg/m   Physical Exam Vitals and nursing note reviewed.  Constitutional:      General: She is in acute distress.     Appearance: Normal appearance. She is not ill-appearing.  HENT:     Head: Normocephalic and atraumatic.     Right Ear: Tympanic membrane, ear canal and external ear normal.     Left Ear: Tympanic membrane, ear canal and external ear normal.     Nose: Nose normal.     Mouth/Throat:     Mouth: Mucous membranes are moist.     Pharynx: Oropharynx is clear.     Comments: Mild to moderate amount of clear drainage of posterior oropharynx noted Eyes:     Extraocular Movements: Extraocular movements intact.     Conjunctiva/sclera: Conjunctivae normal.     Pupils: Pupils are equal, round, and reactive to light.  Neck:     Comments: No JVD, no bruit Cardiovascular:     Rate and Rhythm: Normal rate and regular rhythm.     Pulses: Normal pulses.     Heart sounds: Normal heart sounds.     Comments: 3/6 HSEM noted over left sternal border Pulmonary:     Effort: Pulmonary effort is normal.     Breath sounds: Normal breath sounds.     Comments: No adventitious breath sounds noted Musculoskeletal:        General: Normal range of motion.     Cervical back: Normal range of motion and neck supple. No tenderness.  Lymphadenopathy:     Cervical: No cervical adenopathy.  Skin:    General: Skin is warm and dry.  Neurological:     General: No focal deficit present.     Mental Status: She is alert and oriented to person, place, and time.  Psychiatric:        Mood and Affect: Mood normal.        Behavior: Behavior normal.     UC Treatments / Results  Labs (all labs ordered are listed, but only abnormal results are displayed) Labs Reviewed - No data to  display  EKG   Radiology DG Chest 2 View  Result Date: 11/04/2020 CLINICAL DATA:  Cough. EXAM: CHEST - 2 VIEW COMPARISON:  Chest x-ray 06/18/2016. FINDINGS: Mediastinum and hilar structures normal. Heart size normal. Chronic mild bilateral interstitial changes. No focal infiltrate. No pleural effusion or pneumothorax. Mild thoracic spine scoliosis. Diffuse osteopenia degenerative change thoracic spine. IMPRESSION: Chronic mild bilateral interstitial changes. No acute cardiopulmonary disease identified. Electronically Signed   By: Maisie Fus  Register   On: 11/04/2020 11:13    Procedures Procedures (including critical care time)  Medications Ordered in UC Medications - No data to display  Initial Impression / Assessment and Plan / UC Course  I have reviewed the triage vital signs and the nursing notes.  Pertinent labs & imaging results that were available during my care of the patient were reviewed by me and considered in my medical decision making (see chart for details).    MDM: 1. Cough-has just completed Zithromax this morning prescribed on 10/30/2020, 2.  Chest discomfort-EKG is normal sinus rhythm, 3.  Aortic stenosis, moderate-patient has been advised to follow-up with her cardiologist today after this office visit for reevaluation, 4. Allergic Rhinitis-Allegra 180 mg without D for the next 3 to 5 days.  Discharged home, hemodynamically stable. Final Clinical Impressions(s) / UC Diagnoses   Final diagnoses:  Cough  Chest discomfort  Aortic stenosis, moderate  Allergic rhinitis, unspecified seasonality, unspecified trigger     Discharge Instructions      Instructed/advised patient to follow-up with her cardiologist this morning after this office visit for further evaluation.  Advised patient EKG was NSR, chest x-ray was clear with no abnormality.  Advised patient may take Allegra 180 mg (without D) daily for the next 3 to 5 days for concurrent allergic rhinitis/postnasal  drip.     ED Prescriptions     Medication Sig Dispense Auth. Provider   fexofenadine (ALLEGRA ALLERGY) 180 MG tablet Take 1 tablet (180 mg total) by mouth daily for 15 days. 15 tablet Trevor Iha, FNP      PDMP not reviewed this encounter.   Trevor Iha, FNP 11/04/20 1611

## 2020-11-04 NOTE — ED Triage Notes (Signed)
Pt presents to Urgent Care with c/o cough which has not improved since visit 5 days ago. Also c/o nausea when eating and reports an "episode" yesterday in which she "felt something, but not pain" in her chest, causing her to "fall over onto the bed." Pt denies LOC. Denies cp and nausea currently.

## 2020-11-04 NOTE — Discharge Instructions (Addendum)
Instructed/advised patient to follow-up with her cardiologist this morning after this office visit for further evaluation.  Advised patient EKG was NSR, chest x-ray was clear with no abnormality.  Advised patient may take Allegra 180 mg (without D) daily for the next 3 to 5 days for concurrent allergic rhinitis/postnasal drip.

## 2021-03-26 ENCOUNTER — Emergency Department (INDEPENDENT_AMBULATORY_CARE_PROVIDER_SITE_OTHER)
Admission: EM | Admit: 2021-03-26 | Discharge: 2021-03-26 | Disposition: A | Discharge status: Home / Self Care | Payer: Medicare Other | Source: Home / Self Care

## 2021-03-26 ENCOUNTER — Other Ambulatory Visit: Payer: Self-pay

## 2021-03-26 ENCOUNTER — Encounter: Payer: Self-pay | Admitting: *Deleted

## 2021-03-26 DIAGNOSIS — R059 Cough, unspecified: Secondary | ICD-10-CM | POA: Diagnosis not present

## 2021-03-26 MED ORDER — CEFDINIR 300 MG PO CAPS: 300.0000 mg | ORAL_CAPSULE | Freq: Two times a day (BID) | ORAL | 0 refills | Status: AC

## 2021-03-26 MED ORDER — HYDROCODONE BIT-HOMATROP MBR 5-1.5 MG/5ML PO SOLN: 5.0000 mL | Freq: Four times a day (QID) | ORAL | 0 refills | Status: AC | PRN

## 2021-03-26 MED ORDER — BENZONATATE 200 MG PO CAPS: 200.0000 mg | ORAL_CAPSULE | Freq: Three times a day (TID) | ORAL | 0 refills | Status: AC | PRN

## 2021-03-26 NOTE — Discharge Instructions (Addendum)
Advised patient to take medication as directed with food to completion.  Advised patient may use Tessalon Perles daily or as needed for cough.  Advised patient may use Hycodan when at home and not driving due to sedative effects.  Advised patient not to take Hycodan and [employer] together.  Encouraged patient increase daily water intake while taking these medications.

## 2021-03-26 NOTE — ED Provider Notes (Signed)
[facility]    CSN: [id number] Arrival date & time: 03/26/21  0955      History   Chief Complaint Chief Complaint  Patient presents with   Cough    HPI Gina Gibson is a 87 y.o. female.   HPI 87 year old female presents with fatigue, cough with thick mucus nasal congestion and ear popping for 2 days.  PMH significant for acute bronchitis, and history of pneumonia.  Past Medical History:  Diagnosis Date   Depression    Hypertension    Insomnia     Patient Active Problem List   Diagnosis Date Noted   PNEUMONIA, COMMUNITY ACQUIRED, PNEUMOCOCCAL 06/29/2010   ACUTE BRONCHITIS 03/15/2010   CONTUSION, RIGHT CHEST WALL 03/15/2010   HYPERTENSION 06/10/2009   PNEUMONIA, HX OF 06/10/2009    Past Surgical History:  Procedure Laterality Date   CHOLECYSTECTOMY     HERNIA REPAIR     KNEE SURGERY      OB History   No obstetric history on file.      Home Medications    Prior to Admission medications   Medication Sig Start Date End Date Taking? Authorizing Provider  benzonatate (TESSALON) 200 MG capsule Take 1 capsule (200 mg total) by mouth 3 (three) times daily as needed for up to 7 days for cough. 03/26/21 04/02/21 Yes Eliezer Lofts, FNP  cefdinir (OMNICEF) 300 MG capsule Take 1 capsule (300 mg total) by mouth 2 (two) times daily for 10 days. 03/26/21 04/05/21 Yes Eliezer Lofts, FNP  HYDROcodone bit-homatropine (HYCODAN) 5-1.5 MG/5ML syrup Take 5 mLs by mouth every 6 (six) hours as needed for cough. 03/26/21  Yes Eliezer Lofts, FNP  aspirin 81 MG tablet Take 81 mg by mouth daily.    [provider]  fexofenadine (ALLEGRA ALLERGY) 180 MG tablet Take 1 tablet (180 mg total) by mouth daily for 15 days. 11/04/20 11/19/20  Eliezer Lofts, FNP  fluticasone Asencion Islam) 50 MCG/ACT nasal spray 1 or 2 sprays each nostril twice a day 03/20/14   Jacqulyn Cane, MD  pravastatin (PRAVACHOL) 20 MG tablet Take 20 mg by mouth daily.      [provider]   sertraline (ZOLOFT) 100 MG tablet Take 100 mg by mouth daily.      [provider]  triamterene-hydrochlorothiazide (DYAZIDE) 37.5-25 MG per capsule Take 1 capsule by mouth every morning.    [provider]  verapamil (VERELAN PM) 240 MG 24 hr capsule Take 240 mg by mouth at bedtime.      [provider]    Family History Family History  Problem Relation Age of Onset   Stroke Mother    Coronary artery disease Mother    Stroke Father    Coronary artery disease Father     Social History Social History   Tobacco Use   Smoking status: Former   Smokeless tobacco: Never  Scientific laboratory technician Use: Never used  Substance Use Topics   Alcohol use: No   Drug use: No     Allergies   Latex, Prednisone, Sulfa antibiotics, and Ciprofloxacin   Review of Systems Review of Systems  Constitutional:  Positive for fatigue.  HENT:  Positive for congestion and postnasal drip.   Respiratory:  Positive for cough.   All other systems reviewed and are negative.   Physical Exam Triage Vital Signs ED Triage Vitals [03/26/21 1017]  Enc Vitals Group     BP 127/66     Pulse Rate 69  Resp 18     Temp 98.9 F (37.2 C)     Temp Source Oral     SpO2 96 %     Weight      Height      Head Circumference      Peak Flow      Pain Score      Pain Loc      Pain Edu?      Excl. in [place]?    No data found.  Updated Vital Signs BP 127/66 (BP Location: Left Arm)   Pulse 69   Temp 98.9 F (37.2 C) (Oral)   Resp 18   SpO2 96%   Physical Exam Vitals and nursing note reviewed.  Constitutional:      Appearance: Normal appearance. She is normal weight.  HENT:     Head: Normocephalic and atraumatic.     Right Ear: Tympanic membrane, ear canal and external ear normal.     Left Ear: Tympanic membrane, ear canal and external ear normal.     Nose: Nose normal.     Mouth/Throat:     Mouth: Mucous membranes are moist.     Pharynx: Oropharynx is clear.  Eyes:      Extraocular Movements: Extraocular movements intact.     Conjunctiva/sclera: Conjunctivae normal.     Pupils: Pupils are equal, round, and reactive to light.  Cardiovascular:     Rate and Rhythm: Normal rate and regular rhythm.     Pulses: Normal pulses.     Heart sounds: Normal heart sounds.  Pulmonary:     Effort: Pulmonary effort is normal.     Breath sounds: Rhonchi present.  Musculoskeletal:        General: Normal range of motion.     Cervical back: Normal range of motion and neck supple.  Skin:    General: Skin is warm and dry.  Neurological:     General: No focal deficit present.     Mental Status: She is alert and oriented to person, place, and time.     UC Treatments / Results  Labs (all labs ordered are listed, but only abnormal results are displayed) Labs Reviewed - No data to display  EKG   Radiology No results found.  Procedures Procedures (including critical care time)  Medications Ordered in UC Medications - No data to display  Initial Impression / Assessment and Plan / UC Course  I have reviewed the triage vital signs and the nursing notes.  Pertinent labs & imaging results that were available during my care of the patient were reviewed by me and considered in my medical decision making (see chart for details).     MDM: 1. Cough-Rx'd Cefdinir, Tessalon Perles and Hycodan. Advised patient to take medication as directed with food to completion.  Advised patient may use Tessalon Perles daily or as needed for cough.  Advised patient may use Hycodan when at home and not driving due to sedative effects.  Advised patient not to take Hycodan and [employer] together.  Encouraged patient increase daily water intake while taking these medications.  Discharged home, hemodynamically stable. Final Clinical Impressions(s) / UC Diagnoses   Final diagnoses:  Cough, unspecified type     Discharge Instructions      Advised patient to take medication as  directed with food to completion.  Advised patient may use Tessalon Perles daily or as needed for cough.  Advised patient may use Hycodan when at home and not driving due to  sedative effects.  Advised patient not to take Hycodan and [employer] together.  Encouraged patient increase daily water intake while taking these medications.     ED Prescriptions     Medication Sig Dispense Auth. Provider   cefdinir (OMNICEF) 300 MG capsule Take 1 capsule (300 mg total) by mouth 2 (two) times daily for 10 days. 20 capsule Trevor Iha, FNP   benzonatate (TESSALON) 200 MG capsule Take 1 capsule (200 mg total) by mouth 3 (three) times daily as needed for up to 7 days for cough. 40 capsule Trevor Iha, FNP   HYDROcodone bit-homatropine (HYCODAN) 5-1.5 MG/5ML syrup Take 5 mLs by mouth every 6 (six) hours as needed for cough. 120 mL Trevor Iha, FNP      PDMP not reviewed this encounter.   Trevor Iha, FNP 03/26/21 1201

## 2021-03-26 NOTE — ED Triage Notes (Signed)
X 4 days c/o fatigue, cough with thick mucous that she has trouble coughing up, nasal congestion and ears popping.

## 2021-05-09 DEATH — deceased

## 2022-09-19 IMAGING — DX DG CHEST 2V
2 series · 2 of 2 positions shown · non-contrast
Comparison: Chest x-ray 06/18/2016.

CLINICAL DATA: Cough.

EXAM:
CHEST - 2 VIEW

[chest pa]
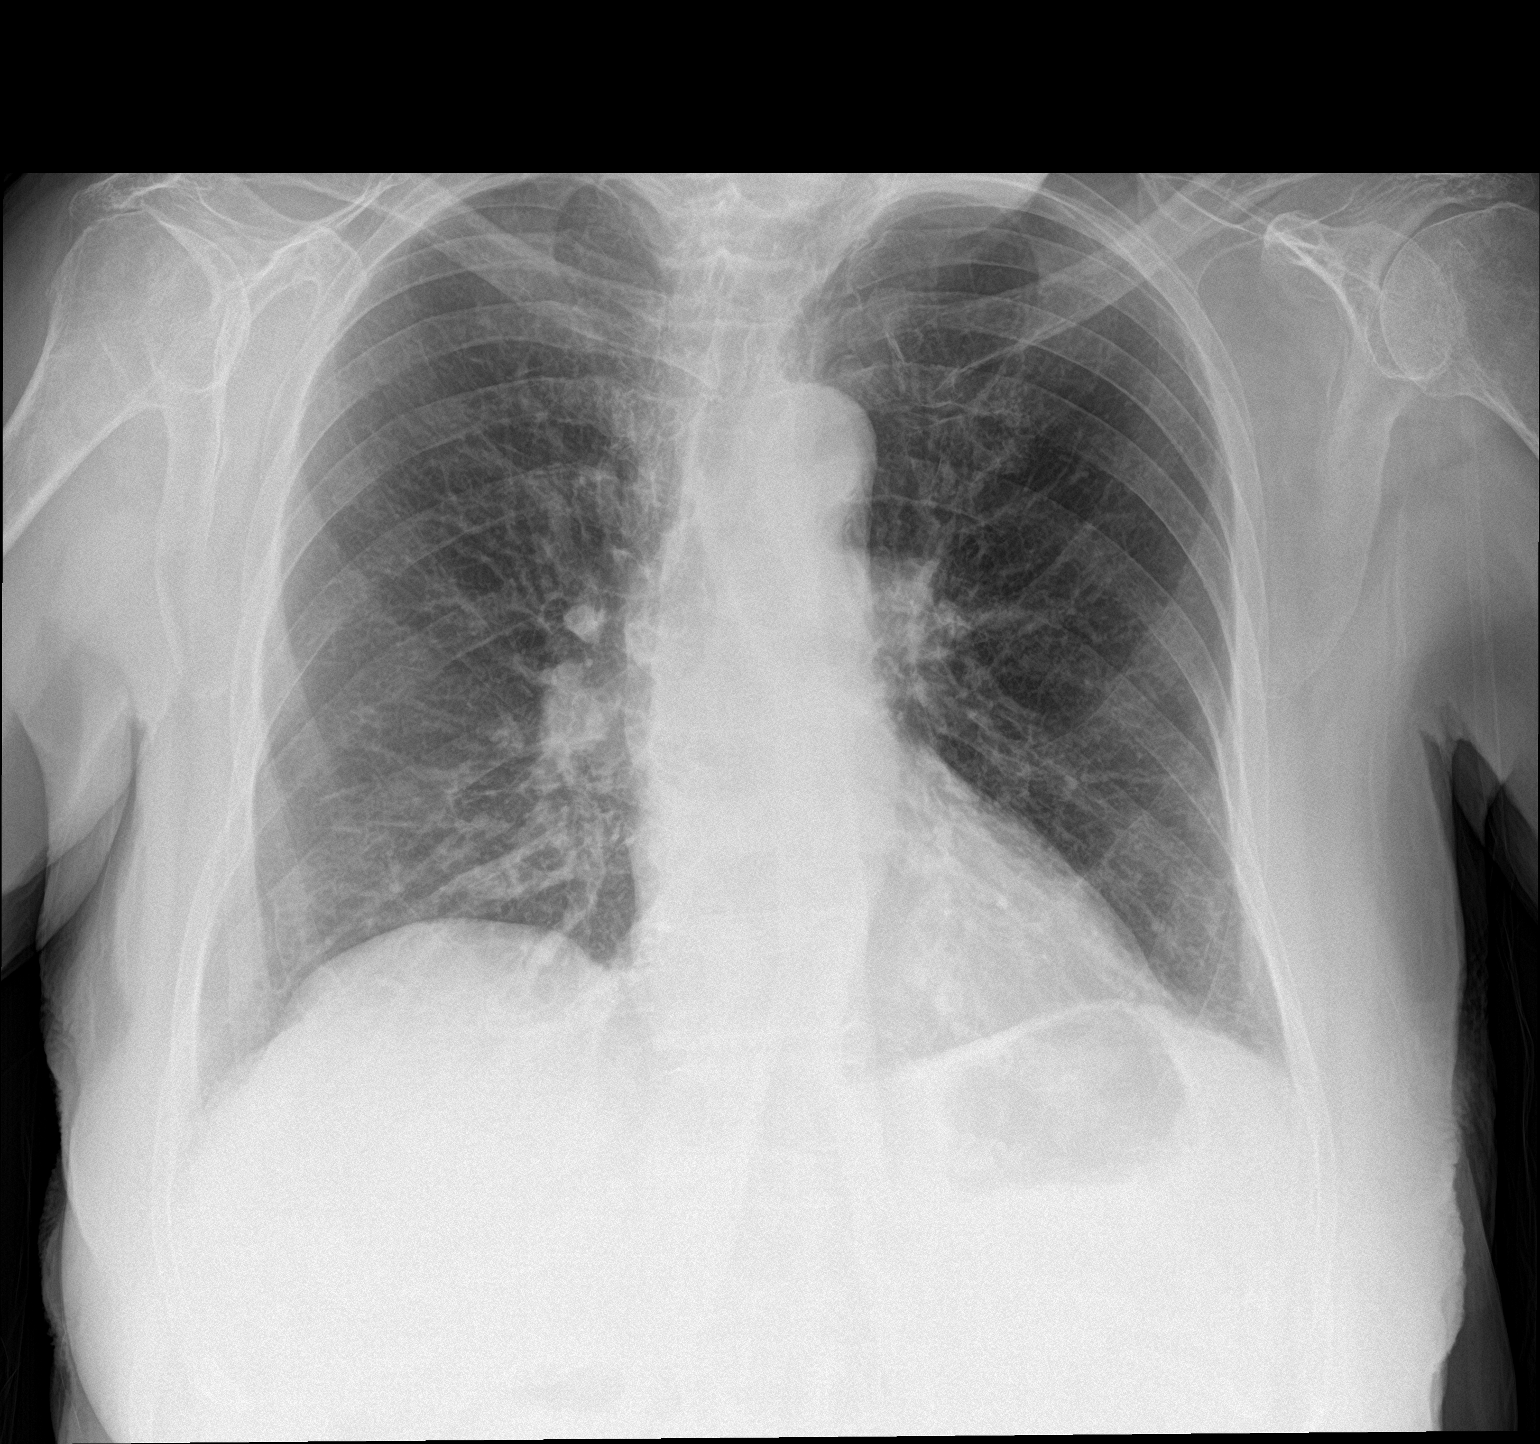

[chest lat]
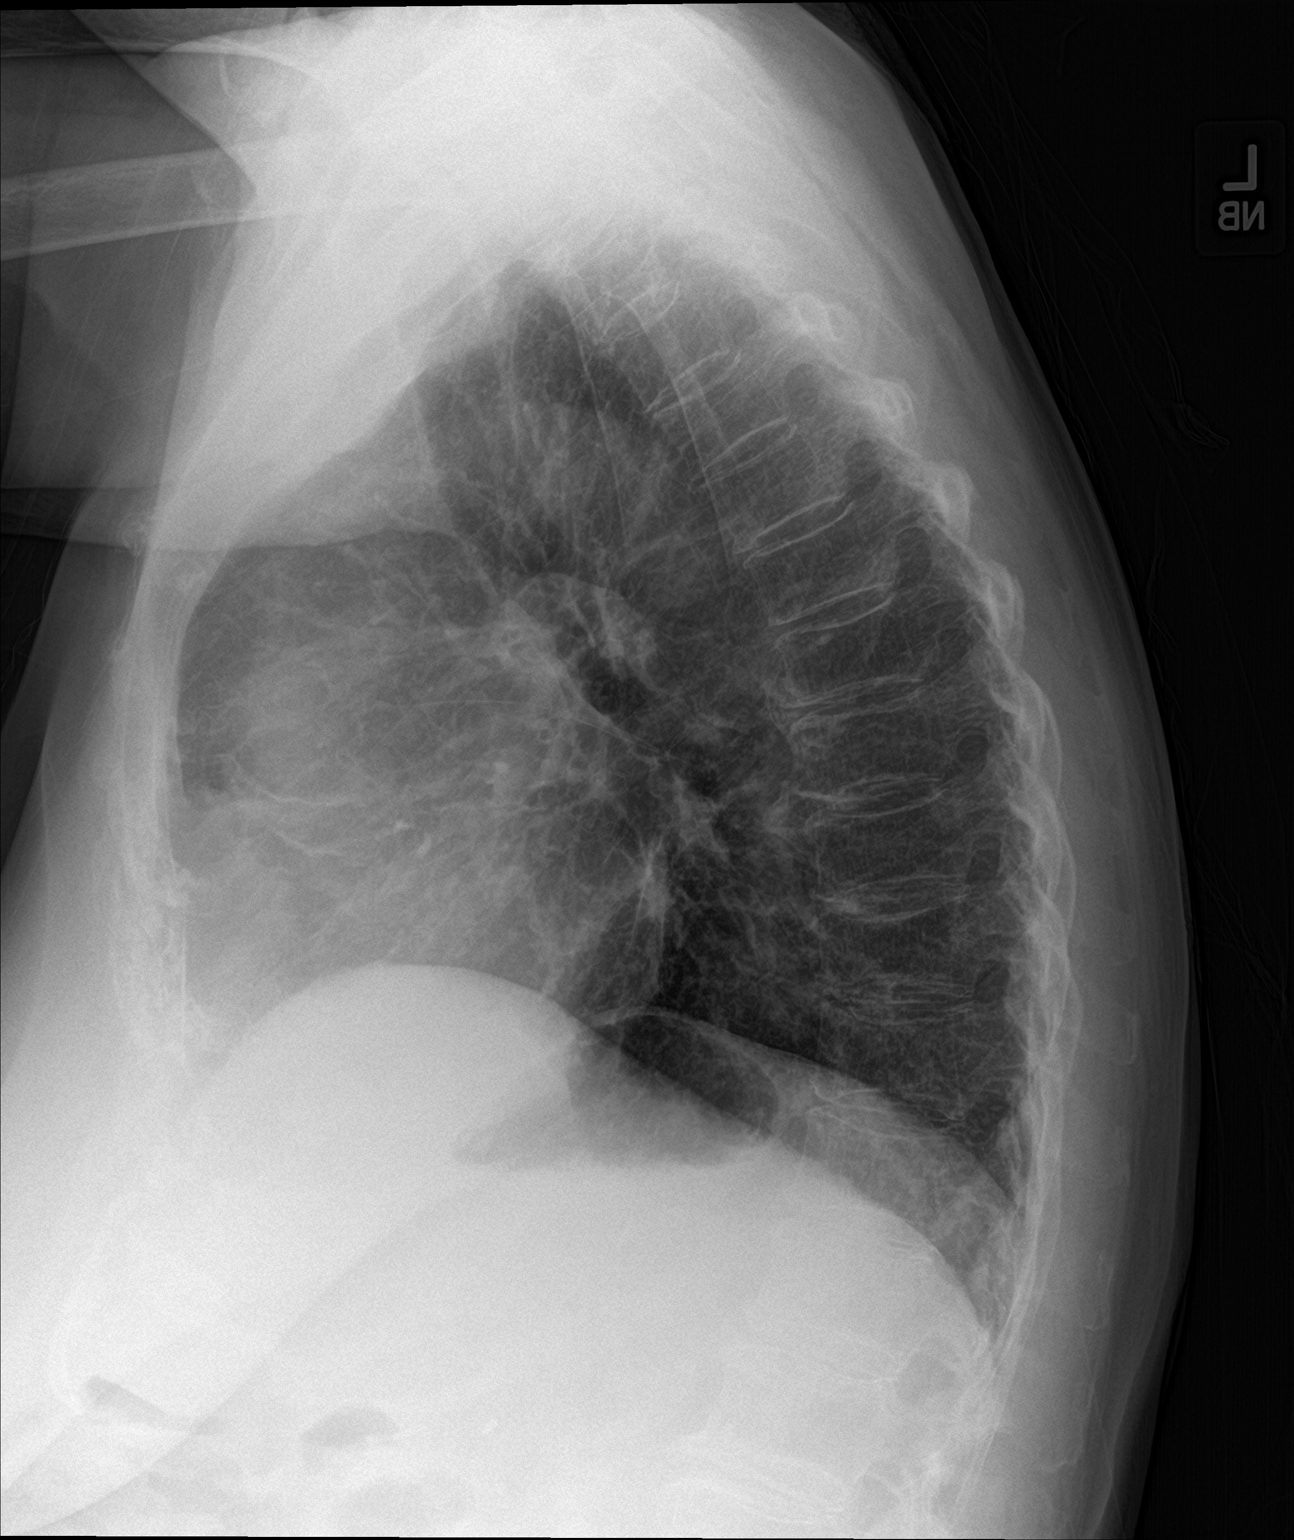

[2 of 2 positions shown; findings below may reference images not displayed]

FINDINGS: Mediastinum and hilar structures normal. Heart size normal. Chronic
mild bilateral interstitial changes. No focal infiltrate. No pleural
effusion or pneumothorax. Mild thoracic spine scoliosis. Diffuse
osteopenia degenerative change thoracic spine.
IMPRESSION: Chronic mild bilateral interstitial changes. No acute
cardiopulmonary disease identified.
